# Patient Record
Sex: Female | Born: 1980 | Race: White | Hispanic: No | Marital: Married
Health system: Midwestern US, Academic
[De-identification: ages and names within clinical notes are randomized; demographics above are authoritative.]

---

## 2017-07-29 ENCOUNTER — Emergency Department: Admit: 2017-07-29 | Discharge: 2017-07-29 | Payer: BC Managed Care – HMO

## 2017-07-29 ENCOUNTER — Encounter: Admit: 2017-07-29 | Discharge: 2017-07-29 | Payer: BC Managed Care – HMO | Primary: Adult Health

## 2017-07-29 ENCOUNTER — Ambulatory Visit: Admit: 2017-07-29 | Discharge: 2017-07-30 | Primary: Adult Health

## 2017-07-29 DIAGNOSIS — R569 Unspecified convulsions: Principal | ICD-10-CM

## 2017-07-29 DIAGNOSIS — R109 Unspecified abdominal pain: ICD-10-CM

## 2017-07-29 DIAGNOSIS — M549 Dorsalgia, unspecified: ICD-10-CM

## 2017-07-29 DIAGNOSIS — I519 Heart disease, unspecified: Principal | ICD-10-CM

## 2017-07-29 DIAGNOSIS — K50819 Crohn's disease of both small and large intestine with unspecified complications: ICD-10-CM

## 2017-07-29 DIAGNOSIS — R112 Nausea with vomiting, unspecified: ICD-10-CM

## 2017-07-29 DIAGNOSIS — S0990XA Unspecified injury of head, initial encounter: ICD-10-CM

## 2017-07-29 LAB — URINALYSIS DIPSTICK
Lab: NEGATIVE K/UL — ABNORMAL HIGH (ref 3–12)
Lab: NEGATIVE MMOL/L (ref 21–30)
Lab: NEGATIVE U/L — ABNORMAL HIGH (ref 25–110)
Lab: NEGATIVE U/L — ABNORMAL HIGH (ref 7–56)

## 2017-07-29 LAB — URINALYSIS, MICROSCOPIC

## 2017-07-29 LAB — CBC AND DIFF
Lab: 0.1 10*3/uL (ref 0–0.20)
Lab: 0.1 10*3/uL (ref 0–0.45)
Lab: 20 10*3/uL — ABNORMAL HIGH (ref 4.5–11.0)

## 2017-07-29 LAB — POC GLUCOSE: Lab: 67 mg/dL — ABNORMAL LOW (ref 70–100)

## 2017-07-29 LAB — COMPREHENSIVE METABOLIC PANEL
Lab: 134 MMOL/L — ABNORMAL LOW (ref 137–147)
Lab: >60 mL/min (ref >60)
Lab: >60 mL/min — ABNORMAL HIGH (ref >60)

## 2017-07-29 LAB — MAGNESIUM: Lab: 1.9 mg/dL — ABNORMAL LOW (ref 1.6–2.6)

## 2017-07-29 MED ORDER — LIDOCAINE HCL 20 MG/ML (2 %) IJ SOLN
50 mL | Freq: Once | INTRAMUSCULAR | 0 refills | Status: AC
Start: 2017-07-29 — End: ?

## 2017-07-29 MED ORDER — HYDROCODONE-ACETAMINOPHEN 5-325 MG PO TAB
1 | ORAL | 0 refills | Status: DC | PRN
Start: 2017-07-29 — End: 2017-07-31
  Administered 2017-07-30 (×2): 1 via ORAL

## 2017-07-29 MED ORDER — VANCOMYCIN IN DEXTROSE 5 % 750 MG/150 ML IV PGBK
15 mg/kg | Freq: Two times a day (BID) | INTRAVENOUS | 0 refills | Status: DC
Start: 2017-07-29 — End: 2017-07-31
  Administered 2017-07-30 (×2): 750 mg via INTRAVENOUS

## 2017-07-29 MED ORDER — SODIUM CHLORIDE 0.9 % IJ SOLN
50 mL | Freq: Once | INTRAVENOUS | 0 refills | Status: CP
Start: 2017-07-29 — End: ?
  Administered 2017-07-29: 17:00:00 50 mL via INTRAVENOUS

## 2017-07-29 MED ORDER — FENTANYL CITRATE (PF) 50 MCG/ML IJ SOLN
50 ug | Freq: Once | INTRAVENOUS | 0 refills | Status: CP
Start: 2017-07-29 — End: ?
  Administered 2017-07-29: 16:00:00 50 ug via INTRAVENOUS

## 2017-07-29 MED ORDER — FENTANYL CITRATE (PF) 50 MCG/ML IJ SOLN
25 ug | INTRAVENOUS | 0 refills | Status: DC | PRN
Start: 2017-07-29 — End: 2017-07-30
  Administered 2017-07-30: 01:00:00 25 ug via INTRAVENOUS

## 2017-07-29 MED ORDER — LACTATED RINGERS IV SOLP
INTRAVENOUS | 0 refills | Status: DC
Start: 2017-07-29 — End: 2017-07-30
  Administered 2017-07-30: 03:00:00 1000.000 mL via INTRAVENOUS

## 2017-07-29 MED ORDER — HYDROCODONE-ACETAMINOPHEN 5-325 MG PO TAB
1 | ORAL | 0 refills | Status: DC | PRN
Start: 2017-07-29 — End: 2017-07-29

## 2017-07-29 MED ORDER — PIPERACILLIN-TAZOBACTAM-DEXTRS 3.375 GRAM/50 ML IV PGBK
3.375 g | Freq: Once | INTRAVENOUS | 0 refills | Status: CP
Start: 2017-07-29 — End: ?
  Administered 2017-07-29: 20:00:00 3.375 g via INTRAVENOUS

## 2017-07-29 MED ORDER — METOCLOPRAMIDE HCL 5 MG/ML IJ SOLN
5 mg | Freq: Once | INTRAVENOUS | 0 refills | Status: CP
Start: 2017-07-29 — End: ?
  Administered 2017-07-29: 16:00:00 5 mg via INTRAVENOUS

## 2017-07-29 MED ORDER — LACTATED RINGERS IV SOLP
1000 mL | Freq: Once | INTRAVENOUS | 0 refills | Status: CP
Start: 2017-07-29 — End: ?
  Administered 2017-07-29: 16:00:00 1000 mL via INTRAVENOUS

## 2017-07-29 MED ORDER — VANCOMYCIN PHARMACY TO MANAGE
1 | 0 refills | Status: DC
Start: 2017-07-29 — End: 2017-07-31

## 2017-07-29 MED ORDER — TRAMADOL 50 MG PO TAB
50 mg | ORAL | 0 refills | Status: DC | PRN
Start: 2017-07-29 — End: 2017-07-29

## 2017-07-29 MED ORDER — VANCOMYCIN IN DEXTROSE 5 % 750 MG/150 ML IV PGBK
15 mg/kg | INTRAVENOUS | 0 refills | Status: DC
Start: 2017-07-29 — End: 2017-07-30

## 2017-07-29 MED ORDER — CEFTRIAXONE INJ 2GM IVP
2 g | Freq: Two times a day (BID) | INTRAVENOUS | 0 refills | Status: DC
Start: 2017-07-29 — End: 2017-07-31
  Administered 2017-07-30 (×2): 2 g via INTRAVENOUS

## 2017-07-29 MED ORDER — ENOXAPARIN 40 MG/0.4 ML SC SYRG
40 mg | Freq: Two times a day (BID) | SUBCUTANEOUS | 0 refills | Status: CN
Start: 2017-07-29 — End: ?

## 2017-07-29 MED ORDER — VANCOMYCIN PHARMACY TO MANAGE
1 | 0 refills | Status: DC
Start: 2017-07-29 — End: 2017-07-30

## 2017-07-29 MED ORDER — IOPAMIDOL 76 % IV SOLN
70 mL | Freq: Once | INTRAVENOUS | 0 refills | Status: CP
Start: 2017-07-29 — End: ?
  Administered 2017-07-29: 17:00:00 70 mL via INTRAVENOUS

## 2017-07-29 NOTE — Consults
Neurology Consultation      Name:  Alyssa Cochran                                             MRN:  [id number]   Admission Date:  07/29/2017  LOS: 0 days  ED31/31                 ASSESSMENT:    Active Problems:    * No active hospital problems. *    Consult type: Opinion with orders    Alyssa Cochran is a 36 y.o.  female with PMH of generalized anxiety disorder, depression and chronic abdominal pain that neurology is consulted to evaluate for possible seizures.     Neuro exam reveals somnolent, emaciated appearing woman with otherwise intact mentation, no cranial nerve abnormalities, intact strength with some giveaway due to reported fatigue, no sensory deficits, brisk reflexes with cross-adductor +, no clonus, toes down going bilaterally.     Imaging/Diagnostic Studies:  WBC 20K  Glucose 61   AST 127  ALT 89   ALK Phos 221    CT Head: No acute abnormality  CT C spine: No acute fracture   CT Abd/Pelvis: Apparent wall thickening and enhancement of the terminal ileum as well as mild enhancement of the ascending colon concerning for inflammatory enterocolitis given the history of Crohn's disease.     Impression:   1. Suspected Generalized Seizure with Fall/Somnolence   36 year old female with no prior seizure history of risk factors except an uncle with Epilepsy presenting with convulsive event on the way to [place] to be evaluated by GI. Event witnessed by husband. Right dystonic posturing along with generalized clonic activity reported from husband as well as choking appearance. Description of event is consistent with generalized seizure. Some element of focality with right dystonic posturing however. Reactive elevation of WBC count, generalized muscle soreness and some somnolence consistent with recent prior convulsive event. Given context of significant stress due to ongoing abdominal pain, insomnia, poor PO intake, and evidence of significant dehydration, seizure was likely provoked. Given some elements of focality and impending admission to the hospital, reasonable to consider obtained workup including MRI Brain with seizure protocol and routine EEG.     Internal medicine impression of possible meningitis due to immunocompromised state, neck stiffness, reported headache and somnolence on examination. Performed beside LP.     2. Chronic Abdominal Pain, Prior Intussusception, Concern for Crohn's Disease   2 year history of recurrent abdominal pains, bowel changes, ex lap requiring small bowel resection due to intussusception. Prior EGD/Colonoscopy. Evidence of focal bowel inflammation on outside records. Carrying diagnosis of Crohn's disease however this has not been confirmed with GI here at [facility]. CT Abdomen with focal bowel inflammation which certainly is suggestive of a Crohn's Flare. GI has been consulted. Patient has been taking 40 mg Prednisone daily for some time making her immunocompromised.      3. Dehydration/Poor Nutritional Status   Patient reports 25 pound weight loss since onset of her abdominal pain. Wt loss ~125->95 pounds in two years. Dr mucous membranes on examination and emaciated appearance. Would benefit from nutritional assessment and concern for malabsorptive process.     RECOMMENDATIONS:  > Recommend admission to medicine for IV hydration, GI consult, Nutritional assessment   > Given abnormal posturing of right hand during event suggestive of some  focality, reasonable to proceed with seizure work up including MRI Brain (Seizure Protocol), Routine EEG  > Seizure precautions; no driving for 6 months from your last spell, this is state law. No climbing ladders, operating heavy machinery, carrying boiling water, bathing or swimming alone, or anything that may cause harm to you or those around you if you were to have spell/seizure.   > Will follow up results of bedside LP, current receiving empiric antibiotics for suspected community acquired meningitis Thank you for allowing [place] to participate in the care of this patient. Please page neurology on call with any further questions or concerns.     Patient seen and plan of care discussed with Dr. Alvester Morin and communicated to primary team.  __________________________________________________________________________________    History of Present Illness: Alyssa Cochran is a 36 y.o. female being seen for neurologic evaluation regarding possible seizure.     History obtained from patient and husband. Husband reports they were driving to her GI appointment this morning and stopped in at a rest station at about 0730 AM. He reports sitting in his car and she was out of the care walking away from him. He looked down at his phone and looked up and saw his wife lying on her back with her right arm straight up in the air and her left arm and legs were shaking repeatedly. He reports her eyes were closed but he opened her eyes and saw they looked rolled back. There was no deviation. He thinks she was choking a little bit. She was rolled on her side.  This lasted for about 45 seconds. Her husband reports she was confused after this event for about 10 minutes and was also complaining of nausea and vomited 6x. Mostly liquid in nature. No tongue biting or bladder incontinence.     She reports no prior symptoms to the event. She reports just blacking out. She has a headache now in the right side of the back of her head. She reports feeling achy all over.     She reports having the flu shot about 3 weeks ago followed by flu like symptoms that have passed. She otherwise feels well.     Risk Factors: She reports an uncle with Epilepsy. She has had no prior seizures. Denies significant head trauma. Highest education level sophomore of high school. No developmental or birth abnormalities.     This has never happened to her before.     She reports taking tramadol for pain control. She repots 2-3 pills per day for the pain. She has been taking this for about a year for abdominal pain. She has not been sleeping very much in the past few nights.     She reports undergoing significant stress and poor sleep recently due to chronic fluctuations in her bowel habits, chronic abdominal pains that she is seeking GI evaluation for regarding possible crohns disease. She has lost about 25-30 pounds in the last two years.     Past Medical History:   Diagnosis Date   ??? Back pain    ??? Heart disease    ??? Stomach pain      Past Surgical History:   Procedure Laterality Date   ??? COLON SURGERY     ??? TUBAL LIGATION       Social History     Social History   ??? Marital status: Married     Spouse name: N/A   ??? Number of children: N/A   ??? Years of education:  N/A     Social History Main Topics   ??? Smoking status: Current Every Day Smoker     Packs/day: 1.00     Years: 15.00   ??? Smokeless tobacco: Never Used   ??? Alcohol use No   ??? Drug use: No   ??? Sexual activity: Not on file     Other Topics Concern   ??? Not on file     Social History Narrative   ??? No narrative on file      Family History   Problem Relation Age of Onset   ??? COPD Mother    ??? Arthritis Mother    ??? Heart Disease Father          Immunizations (includes history and patient reported):   There is no immunization history on file for this patient.        Allergies:  Patient has no known allergies.    Medications:  Prescriptions Prior to Admission   Medication Sig   ??? citalopram (CELEXA) 10 mg tablet Take 60 mg by mouth daily.   ??? electrolyte GUT PEG (NULYTELY, COLYTE, GAVILYTE-N) 420 gram oral solution Mix as directed on package. Drink (8oz) every 10 minutes until gone. Refrigerate once mixed.   ??? HYDROcodone/acetaminophen (NORCO) 5/325 mg tablet Take 1 tablet by mouth every 4 hours as needed for Pain   ??? other medication 1 Dose. Hydrocodone (out of medication)   ??? POTASSIUM (POTASSIMIN PO) Take 40 mEq by mouth daily.   ??? PREDNISONE PO Take 40 mg by mouth daily. ??? traMADol (ULTRAM) 50 mg tablet Take 1 tablet by mouth every 6 hours as needed for Pain. Indications: PAIN   ??? TRAMADOL HCL (TRAMADOL PO) Take  by mouth.     No current facility-administered medications on file prior to encounter.      Current Outpatient Prescriptions on File Prior to Encounter   Medication Sig Dispense Refill   ??? citalopram (CELEXA) 10 mg tablet Take 60 mg by mouth daily.     ??? electrolyte GUT PEG (NULYTELY, COLYTE, GAVILYTE-N) 420 gram oral solution Mix as directed on package. Drink (8oz) every 10 minutes until gone. Refrigerate once mixed. 4000 mL 0   ??? HYDROcodone/acetaminophen (NORCO) 5/325 mg tablet Take 1 tablet by mouth every 4 hours as needed for Pain 20 tablet 0   ??? other medication 1 Dose. Hydrocodone (out of medication)     ??? POTASSIUM (POTASSIMIN PO) Take 40 mEq by mouth daily.     ??? PREDNISONE PO Take 40 mg by mouth daily.     ??? traMADol (ULTRAM) 50 mg tablet Take 1 tablet by mouth every 6 hours as needed for Pain. Indications: PAIN 20 tablet 0   ??? TRAMADOL HCL (TRAMADOL PO) Take  by mouth.         Review of Systems:  Negative except as noted per HPI     Physical Exam:  Vital Signs: Last Filed In 24 Hours Vital Signs: 24 Hour Range   BP: 128/85 (11/08 1100)  Temp: 37.1 ???C (98.7 ???F) (11/08 0856)  Pulse: 78 (11/08 1100)  Respirations: 8 PER MINUTE (11/08 1100)  SpO2: 94 % (11/08 1100)  O2 Delivery: None (Room Air) (11/08 0856)  SpO2 Pulse: 78 (11/08 1100)  Height: 160 cm (63) (11/08 0856) BP: (124-135)/(85-96)   Temp:  [36.7 ???C (98 ???F)-37.1 ???C (98.7 ???F)]   Pulse:  [78-91]   Respirations:  [8 PER MINUTE-13 PER MINUTE]   SpO2:  [94 %-99 %]  O2 Delivery: None (Room Air)   Intensity Pain Scale (Self Report): 7 (07/29/17 0915)      HEENT: Right posterior scalp hematoma from recent fall, normocephalic, eyes open with no discharge, nares patent, oropharynx is clear with no lesions, palate intact, no bruits, dry mucous membranes Chest: normal configuration, non labored breathing, chest rise equal b/l   CV: normal rate and regular rhythm, no murmur, distal pulses palpable  Pulmonary: lungs are clear bilaterally, no wheezes/crackles/rales  Abd: soft, non- tender, no masses, no organomegaly  Skin: no rashes or lesions  Psych: normal      Neuro:  Mental status: Oriented to person/place/time/situation  Memory: Recent and remote memory intact  Attention: Limited Attention   Fund of knowledge: Low   Level of consciousness: Somnolent      Speech:    Fluency: Normal   Comprehension: Normal    Articulation: Normal   Repetition: Normal   Naming: Normal    CN II-XII: Fundoscopic exam without obvious abnormalities. Visual fields intact in all fields, PERRL, EOMI, facial sensation intact. Symmetrical facial movement. Hearing grossly intact to conversation. Strong cough, elevates palate b/l, uvula midline. Strong shoulder shrug. Tongue midline.    Motor: Normal tone and bulk. No abnormal movement, fasciculation or pronator drift. Some give away weakness noted throughout but able to afford full strength initially    NF  NE  SA  EF  EE  WE  WF  FF  FE  FA  TA  HF  HA  HE  KF  KE  DF  PF  In  Ev  TF  TE    R     5  5  5  5  5  5  5  5    5  5  5  5  5  5              L    5  5  5  5  5  5  5  5   5  5  5  5  5  5                Sensory:   Light touch: intact, without sensory level   Pin prick: intact   Vibration (tuning fork 128 Hz): intact   Proprioception: intact    Romberg sign: absent   Cold temp: intact     Reflexes: No clonus, hofmann, Babinski absent. Cross adductor present.    Right Left   Triceps 3 3   Biceps 3 3   Brachioradialis 3 3   Patella 3 3   Ankle 2 2     Cerebellar Funciton/fine movement: normal finger to nose, heel to shin, finger tapping, RAMs.  Gait: slow to rise from seated position; normal stride length and arm swing, no decompensation of turns. Patient able to walk on heels, toes and in tandem. Lab/Radiology/Other Diagnostic Tests:  24-hour labs:    Results for orders placed or performed during the hospital encounter of 07/29/17 (from the past 24 hour(s))   CBC AND DIFF    Collection Time: 07/29/17  9:18 AM   Result Value Ref Range    White Blood Cells 20.4 (H) 4.5 - 11.0 K/UL    RBC 4.54 4.0 - 5.0 M/UL    Hemoglobin 11.0 (L) 12.0 - 15.0 GM/DL    Hematocrit 45.4 36 - 45 %    MCV 79.5 (L) 80 - 100 FL    MCH 24.3 (L) 26 -  34 PG    MCHC 30.6 (L) 32.0 - 36.0 G/DL    RDW 45.4 (H) 11 - 15 %    Platelet Count 457 (H) 150 - 400 K/UL    MPV 8.4 7 - 11 FL    Neutrophils 76 41 - 77 %    Lymphocytes 18 (L) 24 - 44 %    Monocytes 6 4 - 12 %    Eosinophils 0 0 - 5 %    Basophils 0 0 - 2 %    Absolute Neutrophil Count 15.30 (H) 1.8 - 7.0 K/UL    Absolute Lymph Count 3.70 1.0 - 4.8 K/UL    Absolute Monocyte Count 1.20 (H) 0 - 0.80 K/UL    Absolute Eosinophil Count 0.10 0 - 0.45 K/UL    Absolute Basophil Count 0.10 0 - 0.20 K/UL   COMPREHENSIVE METABOLIC PANEL    Collection Time: 07/29/17  9:18 AM   Result Value Ref Range    Sodium 134 (L) 137 - 147 MMOL/L    Potassium 3.9 3.5 - 5.1 MMOL/L    Chloride 96 (L) 98 - 110 MMOL/L    Glucose 61 (L) 70 - 100 MG/DL    Blood Urea Nitrogen <2 (L) 7 - 25 MG/DL    Creatinine 0.98 0.4 - 1.00 MG/DL    Calcium 8.7 8.5 - 11.9 MG/DL    Total Protein 6.3 6.0 - 8.0 G/DL    Total Bilirubin 0.2 (L) 0.3 - 1.2 MG/DL    Albumin 3.4 (L) 3.5 - 5.0 G/DL    Alk Phosphatase 147 (H) 25 - 110 U/L    AST (SGOT) 127 (H) 7 - 40 U/L    CO2 29 21 - 30 MMOL/L    ALT (SGPT) 89 (H) 7 - 56 U/L    Anion Gap 9 3 - 12    eGFR Non African American >60 >60 mL/min    eGFR African American >60 >60 mL/min   MAGNESIUM    Collection Time: 07/29/17  9:18 AM   Result Value Ref Range    Magnesium 1.9 1.6 - 2.6 mg/dL   POC GLUCOSE    Collection Time: 07/29/17  9:20 AM   Result Value Ref Range    Glucose, POC 67 (L) 70 - 100 MG/DL   URINALYSIS DIPSTICK    Collection Time: 07/29/17  9:38 AM   Result Value Ref Range Color,UA YELLOW     Turbidity,UA 1+ (A) CLEAR-CLEAR    Specific Gravity-Urine 1.013 1.003 - 1.035    pH,UA 6.0 5.0 - 8.0    Protein,UA NEG NEG-NEG    Glucose,UA NEG NEG-NEG    Ketones,UA NEG NEG-NEG    Bilirubin,UA NEG NEG-NEG    Blood,UA NEG NEG-NEG    Urobilinogen,UA NORMAL NORM-NORMAL    Nitrite,UA NEG NEG-NEG    Leukocytes,UA NEG NEG-NEG    Urine Ascorbic Acid, UA NEG NEG-NEG   URINALYSIS, MICROSCOPIC    Collection Time: 07/29/17  9:38 AM   Result Value Ref Range    WBCs,UA 0-2 0 - 2 /HPF    RBCs,UA 0-2 0 - 3 /HPF    MucousUA TRACE     Bacteria,UA FEW (A) NEG-NEG    Squamous Epithelial Cells 10-20 0 - 5    Hyaline Cast 5-10    TOTAL PROTEIN-CSF    Collection Time: 07/29/17  6:50 PM   Result Value Ref Range    Total Protein,CSF 30 15 - 45 MG/DL   GLUCOSE-CSF    Collection Time: 07/29/17  6:50 PM   Result Value Ref Range    Glucose,CSF 35 (L) 40 - 75 MG/DL    Xanthrochromia,CSF NONE      Glucose: (!) 61 (07/29/17 0918)  POC Glucose (Download): (!) 67 (07/29/17 0920)  Urine Pregnancy: (S) Negative (07/29/17 1610)    Pertinent radiology reviewed.    Marty Heck, MD  Neurology Resident  Pager [phone]

## 2017-07-29 NOTE — Response Teams
Rapid Response Team Progress Note    Date: 07/29/2017 Time: 9:31 AM  Patient: Alyssa Cochran  Attending: Santa Lighter, MD Service: Emergency Medicine  Admission Date: 07/29/2017  LOS: 0 days    A Code/Rapid Response Timeline Event Report has been created for this patient on 07/29/17 at 0950. RRT was called after patient arrived for an MOB appointment and patients husband reported she had a seizure while at a rest stop and hit her head. Patient's vital signs were stable, patient did have a raised spot on the right side of her head where she fell. Patient taken to ED 31 for further evaluation.        Etheleen Nicks, RN

## 2017-07-29 NOTE — ED Notes
1710: Bed [id number] dirty. Call Leslee for report [id number]

## 2017-07-29 NOTE — Progress Notes
Rapid Response called at 8:29am.  Team arrived, assessment done and was transported by wheelchair to the ED.  Patient was alert and oriented with visible emesis in blue bag.  Patient requested norco prescription and discussed with patient to have 1st time seizure per Husband with fall, hitting head without loss of bladder to have assessment in the ED now.

## 2017-07-29 NOTE — Procedures
The risks and benefits of the procedure were discussed with the patient and informed consent was obtained. Time out was taken, all present agreed on procedure and site.     The patient was placed in the sitting position with the back arched bent forward arms rested on a bedside table rest.  The back was palpated to find the L4-L5 vertebral disc.  Once found the patient was prepped with iodine and ChloraPrep solution. This was allowed to dry.  A sterile drape was then placed over the exam area. 25 mcg of fentanyl were administered to anesthetize the patient. 5 cc of lidocaine were drawn up and the skin and tract were anesthetized.  A spinal needle was then assembled and a standard lumbar puncture approach was taken.  The needle was slowly advanced until return of CSF was found.  Opening pressure was noted to be 24.2 mm of mercury.  4 specimen bottles of 2-3 mL each were obtained and sent to the laboratory for fluid testing. There was minimal xanthochromia seen amongst all fluid obtained.  Minimal blood loss was seen.  The needle was then removed from the spinal tract.  And manual pressure was held for about 1 minute.  A sterile Band-Aid was then placed upon the site of insertion from the needle.  Estimated blood loss was negligible from the procedure.  Patient tolerated the procedure well was understanding of the successful procedure.  She was then placed supine in bed and instructed to stay this way for at least 1 hour.    I perform the exam under direct supervision of Dr. Edd Fabian.    Ernest Haber, MD  Internal Medicine PGY-1    Pager [phone]

## 2017-07-29 NOTE — ED Notes
Report given to Leslee, RN.

## 2017-07-29 NOTE — ED Notes
Pt care assumed from [place], B. Report received. Pt resting on cart waiting for Neurology consult at this time.

## 2017-07-29 NOTE — H&P (View-Only)
Admission History and Physical Examination      Name:  Alyssa Cochran                                             MRN:  [id number]   Admission Date:  07/29/2017                     Assessment/Plan:    Active Problems:    Seizure (HCC)    Alyssa Cochran is a 36 y/o F with a pmh of Crohn's disease who was admitted for a first time seizure after a fall.    #Trauma  #Seizure  -Status post a fall, witnessed by husband  -Febrile 1 week ago, with neck stiffness and altered mental status, photophobia and phonophobia.  -Neuro consulted in the ED.  Plan:  -Neuro consulted, appreciate recs  -Ordered UDS, UA, alcohol level ordered  -Blood cultures ordered in the ED  -Will complete LP at the bedside, and then start on prophylactic antibiotics for community acquired meningitis   -Ordered fluid studies    #Suspected Crohn's disease flare  -Seen previously in GI clinic ordered extensive work-up, but unable to complete due to pt being lost to follow up  Plan:  -Ordered ESR and CRP  -Consult GI    #Depression  -pta antidepressant    #Severe Malnutrition  -Consult dietary    FEN: LR 75 mL/hr, replace lytes prn, Regular Diet  Ppx: holding due to procedure  Code: Full  Dispo: admit to Med-1    Pt seen and discussed with Dr. Vivien Rossetti, MD  Internal Medicine PGY-1    Pager [phone]  __________________________________________________________________________________  Primary Care Physician: Ellyn Hack  PCP Unknown    Chief Complaint:  Seizure  History of Present Illness: Alyssa Cochran is a 36 y.o. female Patient presents for a GI clinic appointment.  She was seen in GI clinic over a year ago for evaluation of Crohn's disease and malnutrition and we had recommended extensive workup including labs, repeat endoscopies, and MR enterography. Patient did not have any of these tests performed due to insurance reasons and has been following with her original gastroenterologist.   ??? The patient states her husband and her were at a rest stop prior to coming in for her gastroenterology appointment, and her husband found her laying outside the car on the pavement seizing.  He also states her right arm was abducted and pronated during this episode.  She denies any history of seizures or seizure activity.  She states that she was febrile 1 week ago with a T-max of 100.4.  This was the day after getting her flu vaccine, and the patient attributed her fever to this.  She did not have any additional fevers.  She has had a headache for the last 2 days that she states is similar to her previous migraine headaches she has had in the past.  She admits to photophobia and phonophobia.  Multiple episodes of nausea and vomiting.  Husband states emesis has not been bloody, and has been the [employer] she drank .  She admits to neck stiffness 2 days ago that her husband massaged her neck muscles and has not had neck stiffness since.  She states she has had no urinary symptoms.  In regards to her history of Crohn's disease, she states  that her bowels have been very hyperactive with multiple bowel movements.  She does however state that the bowel movements are normal for her.  She states she takes prednisone 40 daily for her Crohn's disease.  She is in significant pain, and states that the pain medicine that she received in the ED has made her very sleepy.  Husband states she has not slept for the last 2 days and attributes the sleepiness to this as well.    Past Medical History:   Diagnosis Date   ??? Back pain    ??? Heart disease    ??? Stomach pain      Past Surgical History:   Procedure Laterality Date   ??? COLON SURGERY     ??? TUBAL LIGATION       Family History   Problem Relation Age of Onset   ??? COPD Mother    ??? Arthritis Mother    ??? Heart Disease Father      Social History     Social History   ??? Marital status: Married     Spouse name: N/A   ??? Number of children: N/A   ??? Years of education: N/A Social History Main Topics   ??? Smoking status: Current Every Day Smoker     Packs/day: 1.00     Years: 15.00   ??? Smokeless tobacco: Never Used   ??? Alcohol use No   ??? Drug use: No   ??? Sexual activity: Not on file     Other Topics Concern   ??? Not on file     Social History Narrative   ??? No narrative on file      Immunizations (includes history and patient reported):   There is no immunization history on file for this patient.        Allergies:  Patient has no known allergies.    Medications:    (Not in a hospital admission)  Review of Systems:  A 14 point review of systems was negative except for: Negative except in above HPI    Physical Exam:  Vital Signs: Last Filed In 24 Hours Vital Signs: 24 Hour Range   BP: 105/70 (11/08 1400)  Temp: 37.1 ???C (98.7 ???F) (11/08 4403)  Pulse: 83 (11/08 1400)  Respirations: 12 PER MINUTE (11/08 1400)  SpO2: 95 % (11/08 1400)  O2 Delivery: None (Room Air) (11/08 0856)  SpO2 Pulse: 84 (11/08 1400)  Height: 160 cm (63) (11/08 0856) BP: (105-135)/(70-102)   Temp:  [36.7 ???C (98 ???F)-37.1 ???C (98.7 ???F)]   Pulse:  [78-91]   Respirations:  [8 PER MINUTE-14 PER MINUTE]   SpO2:  [94 %-99 %]   O2 Delivery: None (Room Air)   Intensity Pain Scale (Self Report): 7 (07/29/17 0915)      General:  Alert, cooperative, mild distress, appears stated age  Head: Right-sided scalp abrasion tender to the touch.  Minimal serosanguineous exudate exudate.  Throat: Lips, mucosa and tongue normal.  Teeth and gums normal  Neck:    Supple, symmetrical, trachea midline  Back:  Symmetric, no curvature, ROM normal.  Lungs: Diffuse inspiratory and expiratory wheezes heard bilaterally.  Chest wall:  No tenderness or deformity.  Heart:   Regular rate and rhythm  Abdomen:  Soft.  Diffusely tender to palpation throughout abdomen. Bowel sounds normal.  No masses.  No organomegaly.  Extremities: Extremities normal, atraumatic, no cyanosis or edema  Skin: Skin color, texture, turgor normal.  No rashes or lesions Neurologic: CNII -  XII intact.  Normal strength, sensation and reflexes throughout.    Lab/Radiology/Other Diagnostic Tests:  24-hour labs:    Results for orders placed or performed during the hospital encounter of 07/29/17 (from the past 24 hour(s))   CBC AND DIFF    Collection Time: 07/29/17  9:18 AM   Result Value Ref Range    White Blood Cells 20.4 (H) 4.5 - 11.0 K/UL    RBC 4.54 4.0 - 5.0 M/UL    Hemoglobin 11.0 (L) 12.0 - 15.0 GM/DL    Hematocrit 16.1 36 - 45 %    MCV 79.5 (L) 80 - 100 FL    MCH 24.3 (L) 26 - 34 PG    MCHC 30.6 (L) 32.0 - 36.0 G/DL    RDW 09.6 (H) 11 - 15 %    Platelet Count 457 (H) 150 - 400 K/UL    MPV 8.4 7 - 11 FL    Neutrophils 76 41 - 77 %    Lymphocytes 18 (L) 24 - 44 %    Monocytes 6 4 - 12 %    Eosinophils 0 0 - 5 %    Basophils 0 0 - 2 %    Absolute Neutrophil Count 15.30 (H) 1.8 - 7.0 K/UL    Absolute Lymph Count 3.70 1.0 - 4.8 K/UL    Absolute Monocyte Count 1.20 (H) 0 - 0.80 K/UL    Absolute Eosinophil Count 0.10 0 - 0.45 K/UL    Absolute Basophil Count 0.10 0 - 0.20 K/UL   COMPREHENSIVE METABOLIC PANEL    Collection Time: 07/29/17  9:18 AM   Result Value Ref Range    Sodium 134 (L) 137 - 147 MMOL/L    Potassium 3.9 3.5 - 5.1 MMOL/L    Chloride 96 (L) 98 - 110 MMOL/L    Glucose 61 (L) 70 - 100 MG/DL    Blood Urea Nitrogen <2 (L) 7 - 25 MG/DL    Creatinine 0.45 0.4 - 1.00 MG/DL    Calcium 8.7 8.5 - 40.9 MG/DL    Total Protein 6.3 6.0 - 8.0 G/DL    Total Bilirubin 0.2 (L) 0.3 - 1.2 MG/DL    Albumin 3.4 (L) 3.5 - 5.0 G/DL    Alk Phosphatase 811 (H) 25 - 110 U/L    AST (SGOT) 127 (H) 7 - 40 U/L    CO2 29 21 - 30 MMOL/L    ALT (SGPT) 89 (H) 7 - 56 U/L    Anion Gap 9 3 - 12    eGFR Non African American >60 >60 mL/min    eGFR African American >60 >60 mL/min   MAGNESIUM    Collection Time: 07/29/17  9:18 AM   Result Value Ref Range    Magnesium 1.9 1.6 - 2.6 mg/dL   POC GLUCOSE    Collection Time: 07/29/17  9:20 AM   Result Value Ref Range    Glucose, POC 67 (L) 70 - 100 MG/DL URINALYSIS DIPSTICK    Collection Time: 07/29/17  9:38 AM   Result Value Ref Range    Color,UA YELLOW     Turbidity,UA 1+ (A) CLEAR-CLEAR    Specific Gravity-Urine 1.013 1.003 - 1.035    pH,UA 6.0 5.0 - 8.0    Protein,UA NEG NEG-NEG    Glucose,UA NEG NEG-NEG    Ketones,UA NEG NEG-NEG    Bilirubin,UA NEG NEG-NEG    Blood,UA NEG NEG-NEG    Urobilinogen,UA NORMAL NORM-NORMAL    Nitrite,UA NEG NEG-NEG  Leukocytes,UA NEG NEG-NEG    Urine Ascorbic Acid, UA NEG NEG-NEG   URINALYSIS, MICROSCOPIC    Collection Time: 07/29/17  9:38 AM   Result Value Ref Range    WBCs,UA 0-2 0 - 2 /HPF    RBCs,UA 0-2 0 - 3 /HPF    MucousUA TRACE     Bacteria,UA FEW (A) NEG-NEG    Squamous Epithelial Cells 10-20 0 - 5    Hyaline Cast 5-10      Glucose: (!) 61 (07/29/17 0918)  POC Glucose (Download): (!) 67 (07/29/17 0920)  Urine Pregnancy: (S) Negative (07/29/17 1610)  Pertinent radiology reviewed.    Richarda Osmond, MD  Pager [phone]

## 2017-07-29 NOTE — ED Notes
Neurology at bedside.

## 2017-07-30 ENCOUNTER — Encounter: Admit: 2017-07-30 | Discharge: 2017-07-30 | Payer: BC Managed Care – HMO | Primary: Adult Health

## 2017-07-30 LAB — URINALYSIS DIPSTICK REFLEX TO CULTURE
Lab: NEGATIVE
Lab: NEGATIVE
Lab: NEGATIVE
Lab: NEGATIVE
Lab: NEGATIVE

## 2017-07-30 LAB — CELL COUNT W/DIFF-CSF
Lab: 12 /uL — ABNORMAL HIGH (ref <5)
Lab: 4 %
Lab: 87 %
Lab: 9 %

## 2017-07-30 LAB — GRAM STAIN

## 2017-07-30 LAB — POC GLUCOSE
Lab: 104 mg/dL — ABNORMAL HIGH (ref 70–100)
Lab: 60 mg/dL — ABNORMAL LOW (ref 70–100)
Lab: 88 mg/dL (ref >60)

## 2017-07-30 LAB — CANNABINOIDS-URINE RANDOM: Lab: NEGATIVE (ref 5.0–8.0)

## 2017-07-30 LAB — COMPREHENSIVE METABOLIC PANEL: Lab: 139 MMOL/L — ABNORMAL LOW (ref 137–147)

## 2017-07-30 LAB — SED RATE: Lab: 34 mm/h — ABNORMAL HIGH (ref 0–20)

## 2017-07-30 LAB — EPSTEIN BARR QNT CSF

## 2017-07-30 LAB — GLUCOSE-CSF: Lab: 35 mg/dL — ABNORMAL LOW (ref 40–75)

## 2017-07-30 LAB — HERPES SIMPLEX PCR - NON-BLOOD

## 2017-07-30 LAB — COCAINE-URINE RANDOM: Lab: NEGATIVE /HPF (ref 0–3)

## 2017-07-30 LAB — BENZODIAZEPINES-URINE RANDOM: Lab: NEGATIVE (ref 0–5)

## 2017-07-30 LAB — URINALYSIS MICROSCOPIC REFLEX TO CULTURE

## 2017-07-30 LAB — C REACTIVE PROTEIN (CRP): Lab: 1.2 mg/dL — ABNORMAL HIGH (ref <1.0)

## 2017-07-30 LAB — CBC AND DIFF: Lab: 12 K/UL — ABNORMAL HIGH (ref 4.5–11.0)

## 2017-07-30 LAB — AMPHETAMINES-URINE RANDOM: Lab: NEGATIVE

## 2017-07-30 LAB — ALCOHOL LEVEL: Lab: <10 mg/dL — AB

## 2017-07-30 LAB — TOTAL PROTEIN-CSF: Lab: 30 mg/dL (ref 15–45)

## 2017-07-30 LAB — HUMAN HERPES VIRUS 6 QUANT PCR CSF

## 2017-07-30 MED ORDER — IMS MIXTURE TEMPLATE
30 mg | Freq: Every day | ORAL | 0 refills | Status: DC
Start: 2017-07-30 — End: 2017-07-31

## 2017-07-30 MED ORDER — AMPICILLIN 2G/100ML NS IVPB (MB+)
2 g | INTRAVENOUS | 0 refills | Status: DC
Start: 2017-07-30 — End: 2017-07-31
  Administered 2017-07-30 (×2): 2 g via INTRAVENOUS

## 2017-07-30 MED ORDER — NICOTINE 14 MG/24 HR TD PT24
1 | Freq: Every day | TRANSDERMAL | 0 refills | Status: DC
Start: 2017-07-30 — End: 2017-07-31
  Administered 2017-07-30: 22:00:00 1 via TRANSDERMAL

## 2017-07-30 MED ORDER — PREDNISONE 20 MG PO TAB
40 mg | Freq: Every day | ORAL | 0 refills | Status: DC
Start: 2017-07-30 — End: 2017-07-30
  Administered 2017-07-30: 15:00:00 40 mg via ORAL

## 2017-07-30 MED ORDER — CITALOPRAM 40 MG PO TAB
40 mg | Freq: Every day | ORAL | 0 refills | Status: DC
Start: 2017-07-30 — End: 2017-07-31

## 2017-07-30 MED ORDER — PREDNISONE 10 MG PO TAB
30 mg | ORAL_TABLET | Freq: Every day | ORAL | 0 refills | Status: SS
Start: 2017-07-30 — End: 2017-08-16

## 2017-07-30 MED ORDER — PANTOPRAZOLE 40 MG PO TBEC
40 mg | Freq: Every day | ORAL | 0 refills | Status: DC
Start: 2017-07-30 — End: 2017-07-31

## 2017-07-30 MED ORDER — CITALOPRAM 20 MG PO TAB
10 mg | Freq: Every day | ORAL | 0 refills | Status: DC
Start: 2017-07-30 — End: 2017-07-30
  Administered 2017-07-30: 19:00:00 10 mg via ORAL

## 2017-07-30 MED ORDER — PREDNISONE 10 MG PO TAB
10 mg | ORAL_TABLET | Freq: Every day | ORAL | 0 refills | Status: SS
Start: 2017-07-30 — End: 2017-08-16

## 2017-07-30 MED ORDER — POLYETHYLENE GLYCOL 3350 17 GRAM PO PWPK
1 | Freq: Two times a day (BID) | ORAL | 0 refills | Status: DC
Start: 2017-07-30 — End: 2017-07-31

## 2017-07-30 MED ORDER — SENNOSIDES 8.6 MG PO TAB
1 | Freq: Two times a day (BID) | ORAL | 0 refills | Status: DC
Start: 2017-07-30 — End: 2017-07-31

## 2017-07-30 MED ORDER — CITALOPRAM 20 MG PO TAB
30 mg | Freq: Once | ORAL | 0 refills | Status: CP
Start: 2017-07-30 — End: ?
  Administered 2017-07-30: 20:00:00 30 mg via ORAL

## 2017-07-30 MED ORDER — ENOXAPARIN 30 MG/0.3 ML SC SYRG
30 mg | Freq: Every day | SUBCUTANEOUS | 0 refills | Status: DC
Start: 2017-07-30 — End: 2017-07-31

## 2017-07-30 MED ORDER — DEXTROSE 5%-0.9% SODIUM CHLORIDE IV SOLP
INTRAVENOUS | 0 refills | Status: DC
Start: 2017-07-30 — End: 2017-07-31
  Administered 2017-07-30: 19:00:00 1000.000 mL via INTRAVENOUS

## 2017-07-30 MED ORDER — SENNOSIDES 8.6 MG PO TAB
1 | Freq: Every evening | ORAL | 0 refills | Status: DC
Start: 2017-07-30 — End: 2017-07-30

## 2017-07-30 MED ORDER — PREDNISONE 10 MG PO TAB
5 mg | ORAL_TABLET | Freq: Every day | ORAL | 0 refills | Status: SS
Start: 2017-07-30 — End: 2017-08-16

## 2017-07-30 MED ORDER — PREDNISONE 10 MG PO TAB
20 mg | ORAL_TABLET | Freq: Every day | ORAL | 0 refills | Status: SS
Start: 2017-07-30 — End: 2017-08-16

## 2017-07-30 MED ADMIN — SODIUM CHLORIDE 0.9 % IJ SOLN [7319]: 10 mL | INTRAVENOUS | @ 15:00:00 | Stop: 2017-07-30 | NDC 00409488801

## 2017-07-30 NOTE — Progress Notes
RT Adult Assessment Note    NAME:Alyssa Cochran             MRN: [id number]             DOB:1980-11-25          AGE: 36 y.o.  ADMISSION DATE: 07/29/2017             DAYS ADMITTED: LOS: 1 day    RT Treatment Plan:   Place a nursing order for "IS Q1h While Awake"         Additional Comments:  Impressions of the patient: No apparent distress  Intervention(s)/outcome(s): IS  Patient education that was completed: None  Recommendations to the care team: IS    Vital Signs:  Pulse:  72  RR:  18  SpO2:  93  O2 Device:  None  Liter Flow:    O2%:    Breath Sounds:  Diminished and clear throughout  Respiratory Effort:  Non labored

## 2017-07-30 NOTE — Other
Critical result or procedure called (document test and value, and read back): Glucose 49 Kali  Time MD/NP Notified: 0706  MD/NP Name: MD Mordecai Maes  MD/NP Response/Orders Given:  RN following Hypoglycemic protocol

## 2017-07-30 NOTE — Progress Notes
General Progress Note    Name:  GWEN WOGOMAN   ZOXWR'[id number] Date:  07/30/2017  Admission Date: 07/29/2017  LOS: 1 day                     Assessment/Plan:    Active Problems:    Seizure (HCC)    Bethanne Lundstedt Rosenberger is a 36 y/o F with a pmh of Crohn's disease who was admitted for a first time seizure after a fall.  ???  #Trauma  #Seizure  -Status post a fall, witnessed by husband  -Febrile 1 week ago, with neck stiffness and altered mental status, photophobia and phonophobia.  -Neuro consulted in the ED.  -UDS and alcohol levels negative  Plan:  -Neuro consulted, appreciate recs   -Ordered MRI W&W/O and EEG.  -Blood cultures NGTD  -LP done at the bedside 11/9   -Fluid studies show a mixed picture. With normal protein and slightly decreased glucose with slightly elevated WBCs(showing PMN predominance)   -Will continue Vanc/CFTX D2    -Pt on long term prednisone, questionable Imunnocompromised. Considered adding acyclovir and ampicillin, but will wait for ID recs.  -Consulted ID  ???  #Suspected Crohn's disease flare  -Seen previously in GI clinic ordered extensive work-up, but unable to complete due to pt being lost to follow up  Plan:  -ESR and CRP elevated  -Start to taper prednisone, starting at 30 mg today  -Consult GI, appreciate recs  ???  #Depression  -pta celexa  ???  #Severe Malnutrition  -Consult dietary  ???  FEN: D5NS 75 mL/hr, replace lytes prn, Regular Diet  Ppx: Lovenox 30mg   Code: Full  Dispo: admit to Med-1  ???  Pt seen and discussed with Dr. Edd Fabian  ???  Ernest Haber, MD  Internal Medicine PGY-1  ???  Pager [phone]  ________________________________________________________________________    Subjective  Haig Prophet Wussow is a 35 y.o. female.  Patient states she feels about the same as yesterday. Denies fevers chills overnight. She states her back has been fine since her LP last night. She states her pain has been well controlled with norco. She has not had a BM, but steas her abdominal pian is about the same. Medications  Scheduled Meds:  cefTRIAXone (ROCEPHIN) IVP 2 g 2 g Intravenous Q12H*   lidocaine 2% (20 mg/mL) injection 50 mL 50 mL Injection ONCE   prednisone (DELTASONE) tablet 40 mg 40 mg Oral QDAY   vancomycin  (VANCOCIN)  750 mg in D5W IVPB (premade) 15 mg/kg Intravenous Q12H*   Continuous Infusions:  ??? lactated ringers infusion 75 mL/hr at 07/29/17 2122     PRN and Respiratory Meds:fentaNYL citrate PF Q1H PRN, HYDROcodone/acetaminophen Q6H PRN, vancomycin   IVPB Q12H* **AND** vancomycin, pharmacy to manage Per Pharmacy      Objective:                          Vital Signs: Last Filed                 Vital Signs: 24 Hour Range   BP: 126/80 (11/09 0718)  Temp: 36.8 ???C (98.2 ???F) (11/09 1191)  Pulse: 81 (11/09 0718)  Respirations: 18 PER MINUTE (11/09 0718)  SpO2: 95 % (11/09 0718)  O2 Delivery: None (Room Air) (11/09 0718)  SpO2 Pulse: 74 (11/08 1630)  Height: 160 cm (63) (11/08 1800) BP: (103-134)/(59-102)   Temp:  [36.8 ???C (98.2 ???F)-37.3 ???C (99.1 ???F)]  Pulse:  [67-86]   Respirations:  [8 PER MINUTE-18 PER MINUTE]   SpO2:  [92 %-98 %]   O2 Delivery: None (Room Air)   Intensity Pain Scale (Self Report): 8 (07/30/17 4782) Vitals:    07/29/17 0856 07/29/17 1800   Weight: 43.1 kg (95 lb) 43 kg (94 lb 11.2 oz)       Intake/Output Summary:  (Last 24 hours)    Intake/Output Summary (Last 24 hours) at 07/30/17 0958  Last data filed at 07/30/17 0900   Gross per 24 hour   Intake              240 ml   Output              400 ml   Net             -160 ml           Physical Exam  General appearance: alert, poor nutritional state, cooperative, mild distress and toxic  Neurologic: Grossly normal  Lungs: clear to auscultation bilaterally  Heart: regular rate and rhythm  Abdomen: soft, diffusely tender to palpation with no focal spot of tenderness. Bowel sounds normal. No masses,  no organomegaly  Back:LP site in tact with no bleeding  Extremities: extremities normal, atraumatic, no cyanosis or edema    Lab Review Pertinent labs reviewed    Point of Care Testing  (Last 24 hours)  Glucose: (!) 49 (Critical Value GLU: Called To: ADAM C at: 07:04:59 by: KRO Read back by: ADAM   C  ) (07/30/17 0544)  POC Glucose (Download): 88 (07/30/17 0847)    Radiology and other Diagnostics Review:    Pertinent radiology reviewed.    Richarda Osmond, MD   Pager [phone]

## 2017-07-30 NOTE — Progress Notes
Patient arrived on unit via cart accompanied by family. Patient transferred to the bed with assistance. Assessment completed, refer to flowsheet for details. Orders released, reviewed, and implemented as appropriate. Oriented to surroundings, call light within reach. Plan of care reviewed.  Will continue to monitor and assess.

## 2017-07-30 NOTE — Med Student Progress Note
Medical Student Progress Note -  Inpatient    NAME: Alyssa Cochran                                     MRN: [id number]                 DOB: Nov 15, 1980          AGE: 36 y.o.  ADMISSION DATE: 07/29/2017             DAYS ADMITTED: LOS: 1 day                   General Progress Note    Name:  Alyssa Cochran   [id number] Date:  07/30/2017  Admission Date: 07/29/2017  LOS: 1 day                     Assessment/Plan:    Active Problems:    Seizure [facility])    Mrs. Alyssa Cochran is a pleasant 36 y/o lady with PMH significant for bipolar disorder, anorexia nervosa, recurrent gallstones s/p cholecystectomy, depression and bipolar disorder who is here for diffuse abdominal pain, headache and a suspected seizure with dystonic activity.     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 movement  - S/p fall, husband witnessed patient having tense jerky movements after a fall at a rest stop  - Patient states she was febrile 1 week ago with neck rigidity  Plan  - Neuro consulted, will follow with their recommendations  - Pending UDS, UA, alcohol level undetectable  - CSF and blood cultures pending, CSF cell count shows WBC of 12, RBC of 4700 and 87% neutrophils, glucose of 35, protein of 30, findings concerning for with bacterial meningitis  Plan  - Patient is on IV rosephin and vancomycin  - Will start dexamethasone today 11/9    Suspected Crohn's disease flare  - Patient was seen last year in GI clinic last year, extensive lab workup was ordered   - Patient does not have a diagnosis of Crohn's disease  - Has been taking 40 mg Prednisone PO daily for the past 1 year  - ESR elevated, CRP normal  Plan  - Obtain [facility]  - Symptomatic treatment only    Severe Malnutrition  - Consult dietiary  ???  FEN: IVF, regular diet  PPX: N/A  Code: Full  Dispo: continue inpatient care  ???  Patient was seen and examined with Dr. Syble Creek, MS3    ________________________________________________________________________    Subjective Alyssa Cochran is a 36 y.o. female.  Patient still somnolent this morning, reports of headache and photophobia    Medications  Scheduled Meds:  cefTRIAXone (ROCEPHIN) IVP 2 g 2 g Intravenous Q12H*   lidocaine 2% (20 mg/mL) injection 50 mL 50 mL Injection ONCE   prednisone (DELTASONE) tablet 40 mg 40 mg Oral QDAY   vancomycin  (VANCOCIN)  750 mg in D5W IVPB (premade) 15 mg/kg Intravenous Q12H*   Continuous Infusions:  ??? lactated ringers infusion 75 mL/hr at 07/29/17 2122     PRN and Respiratory Meds:fentaNYL citrate PF Q1H PRN, HYDROcodone/acetaminophen Q6H PRN, vancomycin   IVPB Q12H* **AND** vancomycin, pharmacy to manage Per Pharmacy      Review of Systems:  A 6 point ROS was negative except for GI: positive for pain    Objective:  Vital Signs: Last Filed                 Vital Signs: 24 Hour Range   BP: 126/80 (11/09 0718)  Temp: 36.8 ???C (98.2 ???F) (11/09 1914)  Pulse: 81 (11/09 0718)  Respirations: 18 PER MINUTE (11/09 0718)  SpO2: 95 % (11/09 0718)  O2 Delivery: None (Room Air) (11/09 0718)  SpO2 Pulse: 74 (11/08 1630)  Height: 160 cm (63) (11/08 1800) BP: (103-134)/(59-102)   Temp:  [36.8 ???C (98.2 ???F)-37.3 ???C (99.1 ???F)]   Pulse:  [67-86]   Respirations:  [8 PER MINUTE-18 PER MINUTE]   SpO2:  [92 %-99 %]   O2 Delivery: None (Room Air)   Intensity Pain Scale (Self Report): 6 (07/29/17 2037) Vitals:    07/29/17 0856 07/29/17 1800   Weight: 43.1 kg (95 lb) 43 kg (94 lb 11.2 oz)       Intake/Output Summary:  (Last 24 hours)    Intake/Output Summary (Last 24 hours) at 07/30/17 0850  Last data filed at 07/30/17 0700   Gross per 24 hour   Intake              240 ml   Output                0 ml   Net              240 ml           Physical Exam  Physical Exam   Constitutional: She is oriented to person, place, and time. She appears well-developed and well-nourished.   HENT:   Head: Normocephalic and atraumatic.   Mouth/Throat: Oropharynx is clear and moist. No oropharyngeal exudate. Eyes: Pupils are equal, round, and reactive to light. EOM are normal.   Neck: Normal range of motion. Neck supple. No JVD present. No tracheal deviation present. No thyromegaly present.   Cardiovascular: Normal rate, regular rhythm, normal heart sounds and intact distal pulses.    No murmur heard.  Pulmonary/Chest: Effort normal. No stridor. No respiratory distress. She has wheezes. She has no rales. She exhibits no tenderness.   Abdominal: Soft. Bowel sounds are normal. She exhibits no distension and no mass. There is tenderness. There is guarding. There is no rebound. No hernia.   Lymphadenopathy:     She has no cervical adenopathy.   Neurological: She is alert and oriented to person, place, and time.   Skin: Skin is warm and dry.   Psychiatric: She has a normal mood and affect. Her behavior is normal. Judgment and thought content normal.       Lab Review  24-hour labs:    Results for orders placed or performed during the hospital encounter of 07/29/17 (from the past 24 hour(s))   CULTURE-BLOOD W/SENSITIVITY    Collection Time: 07/29/17  1:20 PM   Result Value Ref Range    Battery Name BLOOD CULTURE     Specimen Description BLOOD  RIGHT  ANTECUBITAL       Special Requests NONE     Culture NO GROWTH 1 DAY     Report Status     CULTURE-BLOOD W/SENSITIVITY    Collection Time: 07/29/17  1:21 PM   Result Value Ref Range    Battery Name BLOOD CULTURE     Specimen Description BLOOD  LEFT  FA       Special Requests NONE     Culture NO GROWTH 1 DAY     Report Status  TOTAL PROTEIN-CSF    Collection Time: 07/29/17  6:50 PM   Result Value Ref Range    Total Protein,CSF 30 15 - 45 MG/DL   GLUCOSE-CSF    Collection Time: 07/29/17  6:50 PM   Result Value Ref Range    Glucose,CSF 35 (L) 40 - 75 MG/DL    Xanthrochromia,CSF NONE    CULTURE-CSF W/SENSITIVITY    Collection Time: 07/29/17  6:50 PM   Result Value Ref Range    Battery Name CSF CULTURE     Specimen Description CSF     Special Requests NONE     Direct Gram Stain FEW NEUTROPHILS  MANY  RBC'S  NO ORGANISMS SEEN       Culture NO GROWTH 1 DAY     Report Status     CELL COUNT W/DIFF-CSF    Collection Time: 07/29/17  6:50 PM   Result Value Ref Range    Cell Count Tube,CSF TUBE 4     White Blood Cells,CSF 12 (H) <5 /UL    Red Blood Cells,CSF 4,700 /UL    Neutrophils, CSF 87 %    Lymphocytes, CSF 9 %    Monocyte/Hisotocyte, CSF 4 %    Clarity,CSF SLT BLOODY     Path Interpretation, CSF HEMORRHAGIC FLUID  ACUTE INFLAMMATION       Pathologist Signature       INTERPRETED BY FRED PLAPP M.D.  By the PATH SIGNATURE ABOVE, I attest that I have personally formulated the   final interpretation expressed in this report and that the above diagnosis is   based upon my examination of the slides and/or other material indicated in this   report.     Romie Minus STAIN    Collection Time: 07/29/17  6:50 PM   Result Value Ref Range    Battery Name GRAM STAIN      Specimen Description CSF     Special Requests NONE     Gram Stain FEW  NEUTROPHILS  MANY  RBC'S  NO ORGANISMS SEEN       Report Status FINAL  07/29/2017      C REACTIVE PROTEIN (CRP)    Collection Time: 07/29/17  8:40 PM   Result Value Ref Range    C-Reactive Protein 1.20 (H) <1.0 MG/DL   SED RATE    Collection Time: 07/29/17  8:40 PM   Result Value Ref Range    Sed Rate -ESR 34 (H) 0 - 20 MM/HR   ALCOHOL LEVEL    Collection Time: 07/29/17  8:40 PM   Result Value Ref Range    Alcohol <10 MG/DL   CBC AND DIFF    Collection Time: 07/30/17  5:44 AM   Result Value Ref Range    White Blood Cells 12.4 (H) 4.5 - 11.0 K/UL    RBC 4.38 4.0 - 5.0 M/UL    Hemoglobin 10.8 (L) 12.0 - 15.0 GM/DL    Hematocrit 16.1 (L) 36 - 45 %    MCV 79.3 (L) 80 - 100 FL    MCH 24.5 (L) 26 - 34 PG    MCHC 30.9 (L) 32.0 - 36.0 G/DL    RDW 09.6 (H) 11 - 15 %    Platelet Count 535 (H) 150 - 400 K/UL    MPV 8.1 7 - 11 FL    Neutrophils 56 41 - 77 %    Lymphocytes 32 24 - 44 %    Monocytes 10 4 - 12 %    Eosinophils  1 0 - 5 %    Basophils 1 0 - 2 % Absolute Neutrophil Count 7.00 1.8 - 7.0 K/UL    Absolute Lymph Count 4.00 1.0 - 4.8 K/UL    Absolute Monocyte Count 1.20 (H) 0 - 0.80 K/UL    Absolute Eosinophil Count 0.10 0 - 0.45 K/UL    Absolute Basophil Count 0.10 0 - 0.20 K/UL   COMPREHENSIVE METABOLIC PANEL    Collection Time: 07/30/17  5:44 AM   Result Value Ref Range    Sodium 139 137 - 147 MMOL/L    Potassium 3.7 3.5 - 5.1 MMOL/L    Chloride 101 98 - 110 MMOL/L    Glucose 49 (LL) 70 - 100 MG/DL    Blood Urea Nitrogen 2 (L) 7 - 25 MG/DL    Creatinine 2.95 0.4 - 1.00 MG/DL    Calcium 8.5 8.5 - 62.1 MG/DL    Total Protein 5.5 (L) 6.0 - 8.0 G/DL    Total Bilirubin 0.3 0.3 - 1.2 MG/DL    Albumin 2.9 (L) 3.5 - 5.0 G/DL    Alk Phosphatase 308 (H) 25 - 110 U/L    AST (SGOT) 45 (H) 7 - 40 U/L    CO2 30 21 - 30 MMOL/L    ALT (SGPT) 59 (H) 7 - 56 U/L    Anion Gap 8 3 - 12    eGFR Non African American >60 >60 mL/min    eGFR African American >60 >60 mL/min   URINALYSIS DIPSTICK REFLEX TO CULTURE    Collection Time: 07/30/17  5:55 AM   Result Value Ref Range    Color,UA YELLOW     Turbidity,UA CLEAR CLEAR-CLEAR    Specific Gravity-Urine 1.010 1.003 - 1.035    pH,UA 8.0 5.0 - 8.0    Protein,UA NEG NEG-NEG    Glucose,UA NEG NEG-NEG    Ketones,UA TRACE (A) NEG-NEG    Bilirubin,UA NEG NEG-NEG    Blood,UA NEG NEG-NEG    Urobilinogen,UA NORMAL NORM-NORMAL    Nitrite,UA NEG NEG-NEG    Leukocytes,UA NEG NEG-NEG    Urine Ascorbic Acid, UA NEG NEG-NEG   URINALYSIS MICROSCOPIC REFLEX TO CULTURE    Collection Time: 07/30/17  5:55 AM   Result Value Ref Range    WBCs,UA 0-2 0 - 2 /HPF    RBCs,UA 0-2 0 - 3 /HPF    Comment,UA       Urine submitted for reflex culture if criteria are met:WBC>10, positive nitrite   and/or >=1+ leukocyte esterase. If quantity is not sufficient, an addendum will   follow.      Squamous Epithelial Cells 0-2 0 - 5   COCAINE-URINE RANDOM    Collection Time: 07/30/17  5:55 AM   Result Value Ref Range    Cocaine-Urine NEG NEG-NEG   CANNABINOIDS-URINE RANDOM Collection Time: 07/30/17  5:55 AM   Result Value Ref Range    THC NEG NEG-NEG   BENZODIAZEPINES-URINE RANDOM    Collection Time: 07/30/17  5:55 AM   Result Value Ref Range    Benzodiazepines NEG NEG-NEG   AMPHETAMINES-URINE RANDOM    Collection Time: 07/30/17  5:55 AM   Result Value Ref Range    Amphetamines NEG NEG-NEG   POC GLUCOSE    Collection Time: 07/30/17  7:10 AM   Result Value Ref Range    Glucose, POC 60 (L) 70 - 100 MG/DL   POC GLUCOSE    Collection Time: 07/30/17  7:28 AM   Result Value Ref Range  Glucose, POC 104 (H) 70 - 100 MG/DL   POC GLUCOSE    Collection Time: 07/30/17  8:47 AM   Result Value Ref Range    Glucose, POC 88 70 - 100 MG/DL       Point of Care Testing  (Last 24 hours)  Glucose: (!) 49 (Critical Value GLU: Called To: ADAM C at: 07:04:59 by: KRO Read back by: ADAM   C  ) (07/30/17 0544)  POC Glucose (Download): 88 (07/30/17 0847)  Urine Pregnancy: (S) Negative (07/29/17 4540)    Radiology and other Diagnostics Review:    Pertinent radiology reviewed.    Windy Kalata   Pager

## 2017-07-30 NOTE — Consults
Gastroenterology Consult Note  Patient Name:Alyssa Cochran         ZOX:[id number]  Admission Date: 07/29/2017  8:50 AM      Active Problems:    Seizure [facility])      Reason for consult:  Concern for Crohns disease, admitted for seizures    Assessment:  Alyssa Cochran is a 36 y.o. female with history of Crohn's disease who was admitted for a first time seizure after a fall.  She has been experiencing fever for about a week prior to admission, some neck stiffness and altered mental status associated with photophobia and phonophobia and is being evaluated for meningitis and other possible infections.    We have been consulted because she had been seen in the GI clinic previously and she was supposed to undergo workup for evaluation of Crohn's disease and malnutrition which was planned over a year ago and has not been completed yet due to insurance issues.    # abdominal distention and pain, hematochezia, diarrhea and nausea  # weight loss, anorexia, malnourishment  # Concern for Crohn's disease, unclear diagnosis (patient has been worked up at [facility], CRP 1.2  # CT findings 07/29/17 consistent with ileocolitis w/ TI and ascending colon wall thickening  # s/p partial small bowel resection ? in 2017  # Microcytic anemia  # has been on chronic steroids 40 mg po prednisone daily for >1 year, unclear which doctor has been prescribing  # Patient appears dehydrated, given that she is currently not eating or tolerating p.o. intake, may consider maintenance fluids    Recommendations:  - - considering her elevated LFT's and FTT, please also check INR, also please check MRCP  - Currently given the high suspicion for possible infection and her acute neurologic problems, and stable hemoglobin, there is no acute indication to do colonoscopy, we will reassess for endoscopic evaluation next week  - Given her chronic prednisone use, the steroids will need to be tapered off - Check iron studies, Vit B 12, folate, Vit A, Vit D, B6, B1, zinc, selenium levels  - Once her acute neurologic problems or infections are resolving, consider nutrition consult and do calorie counts  - Obtain operative report of her intestinal surgery (including pathology results and imaging findings)  - She has been prescribed prednisone by a GI doctor,?  At [facility], please obtain records  - Patient currently reports having been constipated for the past couple of days, we will needed aggressive bowel regimen (MiraLAX twice daily, senna at bedtime), if this is not successful we can do 2 L GoLYTELY (if not tolerated due to nausea, patient agrees to place an NG tube and GoLYTELY to be given through the NG tube)  - start on maintenance fluids  - Patient will need bone density scan (either as inpatient or upon discharge)  - We will follow-up in clinic closely after her discharge    Patient discussed with attending on service, Dr. Burnis Medin.    Renea Ee, MD  Gastroenterology & Hepatology Fellow  Pager [phone]      -----------------------------  HPI/Subjective:  Alyssa Cochran Patient is a 36 y.o. female with history of Crohn's disease who was admitted for a first time seizure after a fall.  She has been experiencing fever for about a week prior to admission, some neck stiffness and altered mental status associated with photophobia and phonophobia and is being evaluated for meningitis and other possible infections.    We have been  consulted because she had been seen in the GI clinic previously and she was supposed to undergo workup for evaluation of Crohn's disease and malnutrition which was planned over a year ago and has not been completed yet due to insurance issues.    Chronically before she got acutely ill, she was complaining of abdominal distention and pain, hematochezia, diarrhea and nausea for the past 2-3 years.  She reports crampy right upper quadrant and flank pain that is there every day 8 out of 10, not associated with food intake, no alleviating factors, the pain is nonradiating.  She reports she has usually bowel movements every day that are soft to loose and mixed in with little amounts of blood 2-3 times per week.  She is currently not nauseous, but feels queasy.    She reports that her father has IBD, she does not know whether Crohn's or ulcerative colitis.    Social history:  She smokes about 1 pack/day, does not drink any alcohol, no recreational drug use    Surgical history:  Status post cholecystectomy, tubal ligation  s/p partial small bowel resection 2017    CT AP w/ contrast 07/29/17:   A left lower quadrant small bowel anastomosis is noted. No dilated loops of bowel are seen to suggest obstruction. The terminal ileum is incompletely distended with apparent wall thickening and inner wall enhancement. There is mild inner wall enhancement of the ascending colon. Appendix is normal. No ascites. No mesenteric adenopathy.  Diffuse hepatic steatosis.    Prior endoscopies:  Flexible sigmoidoscopy 07/2015 Abdominal pain in the left upper quadrant, Diarrhea -erythematous mucosa in the proximal rectum, biopsied for IBD  Colonoscopy 04/2014 for abdominal pain, change in bowel habits, and weight loss -erythematous mucosa in the sigmoid colon, and the descending colon, at the splenic flexure and the transverse colon, which was biopsied, examined portion of the ileum was normal, biopsied.  EGD 04/2014 Generalized abdominal pain, Nausea with vomiting, Weight loss -normal findings on endoscopy, biopsied duodenum, stomach, esophagus    CT enterography with contrast in September 2015 did not show any discrete acute abdominal or pelvic pathology.  At that time patient has been on prednisone for 2 weeks.    Gastric emptying study in 2017 showed rapid progression of radiotracer from the stomach into the small bowel (after 1 hour only 7% of radiotracer was in the stomach)    PMH: Past Medical History:   Diagnosis Date   ??? Back pain    ??? Heart disease    ??? Stomach pain        Current medications:  No current facility-administered medications on file prior to encounter.      Current Outpatient Prescriptions on File Prior to Encounter   Medication Sig Dispense Refill   ??? citalopram (CELEXA) 10 mg tablet Take 60 mg by mouth daily.     ??? electrolyte GUT PEG (NULYTELY, COLYTE, GAVILYTE-N) 420 gram oral solution Mix as directed on package. Drink (8oz) every 10 minutes until gone. Refrigerate once mixed. 4000 mL 0   ??? HYDROcodone/acetaminophen (NORCO) 5/325 mg tablet Take 1 tablet by mouth every 4 hours as needed for Pain 20 tablet 0   ??? POTASSIUM (POTASSIMIN PO) Take 40 mEq by mouth daily.     ??? PREDNISONE PO Take 40 mg by mouth daily.     ??? traMADol (ULTRAM) 50 mg tablet Take 1 tablet by mouth every 6 hours as needed for Pain. Indications: PAIN 20 tablet 0  Past Surgical History:   Procedure Laterality Date   ??? COLON SURGERY     ??? TUBAL LIGATION       Social History     Social History   ??? Marital status: Married     Spouse name: N/A   ??? Number of children: N/A   ??? Years of education: N/A     Occupational History   ??? Not on file.     Social History Main Topics   ??? Smoking status: Current Every Day Smoker     Packs/day: 1.00     Years: 15.00   ??? Smokeless tobacco: Never Used   ??? Alcohol use No   ??? Drug use: No   ??? Sexual activity: Not on file     Other Topics Concern   ??? Not on file     Social History Narrative   ??? No narrative on file     Family History   Problem Relation Age of Onset   ??? COPD Mother    ??? Arthritis Mother    ??? Heart Disease Father        Review of Systems:  General: No fevers, chills, weight loss or gain, B-symptoms.  Oropharynx: No lesion, ulcer.  Neck: No pain or decreased ROM.  Pulm: No dyspnea, cough.  CV: No palpitations, no chest pain.  Abdomen: No abdominal pain, brown stool, no changes in bowel habits.  Extremities: No swelling, no deformity. Heme: No for easy bleeding or bruising.  Neuro: No lightheadedness/dizziness, no LOC.  Skin: No rashes, no jaundice.  Please see HPI for additional pertinent documentation    Physical Exam:  Vitals:    07/29/17 2122 07/29/17 2220 07/29/17 2345 07/30/17 0718   BP: 118/64  105/59 126/80   Pulse: 80 72 67 81   Temp: 37.3 ???C (99.1 ???F)  37.1 ???C (98.7 ???F) 36.8 ???C (98.2 ???F)   SpO2: 92% 93% 93% 95%   Weight:       Height:         General - Alert and oriented, no acute distress.   Head - Normocephalic, atraumatic.   Eyes -  EOMI grossly. No icterus or injection.   Oropharynx- No ulcer or bleeding, moist mucosa.  Neck - No swelling or tracheal deviation.   Pulmonary/Chest - Normal effort, normal breath sounds.  CV - Normal rate, regular rhythm, normal heart sounds and intact distal pulses.  Abd - ND, soft, Non-TTP. No hepatospenomegaly. Normal bowel sounds. No masses palpable.  Extremities - Warm, dry.   Skin - No exposed rash, lesion.  Neurological - AAOx4, No gross deficit    Labs/Imaging:  Pertient labs/imaging was reviewed on initiation of progress note.   24-hour labs:    Results for orders placed or performed during the hospital encounter of 07/29/17 (from the past 24 hour(s))   CBC AND DIFF    Collection Time: 07/29/17  9:18 AM   Result Value Ref Range    White Blood Cells 20.4 (H) 4.5 - 11.0 K/UL    RBC 4.54 4.0 - 5.0 M/UL    Hemoglobin 11.0 (L) 12.0 - 15.0 GM/DL    Hematocrit 16.1 36 - 45 %    MCV 79.5 (L) 80 - 100 FL    MCH 24.3 (L) 26 - 34 PG    MCHC 30.6 (L) 32.0 - 36.0 G/DL    RDW 09.6 (H) 11 - 15 %    Platelet Count 457 (H) 150 - 400 K/UL  MPV 8.4 7 - 11 FL    Neutrophils 76 41 - 77 %    Lymphocytes 18 (L) 24 - 44 %    Monocytes 6 4 - 12 %    Eosinophils 0 0 - 5 %    Basophils 0 0 - 2 %    Absolute Neutrophil Count 15.30 (H) 1.8 - 7.0 K/UL    Absolute Lymph Count 3.70 1.0 - 4.8 K/UL    Absolute Monocyte Count 1.20 (H) 0 - 0.80 K/UL    Absolute Eosinophil Count 0.10 0 - 0.45 K/UL Absolute Basophil Count 0.10 0 - 0.20 K/UL   COMPREHENSIVE METABOLIC PANEL    Collection Time: 07/29/17  9:18 AM   Result Value Ref Range    Sodium 134 (L) 137 - 147 MMOL/L    Potassium 3.9 3.5 - 5.1 MMOL/L    Chloride 96 (L) 98 - 110 MMOL/L    Glucose 61 (L) 70 - 100 MG/DL    Blood Urea Nitrogen <2 (L) 7 - 25 MG/DL    Creatinine 6.43 0.4 - 1.00 MG/DL    Calcium 8.7 8.5 - 32.9 MG/DL    Total Protein 6.3 6.0 - 8.0 G/DL    Total Bilirubin 0.2 (L) 0.3 - 1.2 MG/DL    Albumin 3.4 (L) 3.5 - 5.0 G/DL    Alk Phosphatase 518 (H) 25 - 110 U/L    AST (SGOT) 127 (H) 7 - 40 U/L    CO2 29 21 - 30 MMOL/L    ALT (SGPT) 89 (H) 7 - 56 U/L    Anion Gap 9 3 - 12    eGFR Non African American >60 >60 mL/min    eGFR African American >60 >60 mL/min   MAGNESIUM    Collection Time: 07/29/17  9:18 AM   Result Value Ref Range    Magnesium 1.9 1.6 - 2.6 mg/dL   POC GLUCOSE    Collection Time: 07/29/17  9:20 AM   Result Value Ref Range    Glucose, POC 67 (L) 70 - 100 MG/DL   URINALYSIS DIPSTICK    Collection Time: 07/29/17  9:38 AM   Result Value Ref Range    Color,UA YELLOW     Turbidity,UA 1+ (A) CLEAR-CLEAR    Specific Gravity-Urine 1.013 1.003 - 1.035    pH,UA 6.0 5.0 - 8.0    Protein,UA NEG NEG-NEG    Glucose,UA NEG NEG-NEG    Ketones,UA NEG NEG-NEG    Bilirubin,UA NEG NEG-NEG    Blood,UA NEG NEG-NEG    Urobilinogen,UA NORMAL NORM-NORMAL    Nitrite,UA NEG NEG-NEG    Leukocytes,UA NEG NEG-NEG    Urine Ascorbic Acid, UA NEG NEG-NEG   URINALYSIS, MICROSCOPIC    Collection Time: 07/29/17  9:38 AM   Result Value Ref Range    WBCs,UA 0-2 0 - 2 /HPF    RBCs,UA 0-2 0 - 3 /HPF    MucousUA TRACE     Bacteria,UA FEW (A) NEG-NEG    Squamous Epithelial Cells 10-20 0 - 5    Hyaline Cast 5-10    CULTURE-BLOOD W/SENSITIVITY    Collection Time: 07/29/17  1:20 PM   Result Value Ref Range    Battery Name BLOOD CULTURE     Specimen Description BLOOD  RIGHT  ANTECUBITAL       Special Requests NONE     Culture NO GROWTH 1 DAY     Report Status CULTURE-BLOOD W/SENSITIVITY    Collection Time: 07/29/17  1:21 PM  Result Value Ref Range    Battery Name BLOOD CULTURE     Specimen Description BLOOD  LEFT  FA       Special Requests NONE     Culture NO GROWTH 1 DAY     Report Status     TOTAL PROTEIN-CSF    Collection Time: 07/29/17  6:50 PM   Result Value Ref Range    Total Protein,CSF 30 15 - 45 MG/DL   GLUCOSE-CSF    Collection Time: 07/29/17  6:50 PM   Result Value Ref Range    Glucose,CSF 35 (L) 40 - 75 MG/DL    Xanthrochromia,CSF NONE    CULTURE-CSF W/SENSITIVITY    Collection Time: 07/29/17  6:50 PM   Result Value Ref Range    Battery Name CSF CULTURE     Specimen Description CSF     Special Requests NONE     Direct Gram Stain FEW  NEUTROPHILS  MANY  RBC'S  NO ORGANISMS SEEN       Culture NO GROWTH 1 DAY     Report Status     CELL COUNT W/DIFF-CSF    Collection Time: 07/29/17  6:50 PM   Result Value Ref Range    Cell Count Tube,CSF TUBE 4     White Blood Cells,CSF 12 (H) <5 /UL    Red Blood Cells,CSF 4,700 /UL    Neutrophils, CSF 87 %    Lymphocytes, CSF 9 %    Monocyte/Hisotocyte, CSF 4 %    Clarity,CSF SLT BLOODY     Path Interpretation, CSF      Pathologist Signature     GRAM STAIN    Collection Time: 07/29/17  6:50 PM   Result Value Ref Range    Battery Name GRAM STAIN      Specimen Description CSF     Special Requests NONE     Gram Stain FEW  NEUTROPHILS  MANY  RBC'S  NO ORGANISMS SEEN       Report Status FINAL  07/29/2017      C REACTIVE PROTEIN (CRP)    Collection Time: 07/29/17  8:40 PM   Result Value Ref Range    C-Reactive Protein 1.20 (H) <1.0 MG/DL   SED RATE    Collection Time: 07/29/17  8:40 PM   Result Value Ref Range    Sed Rate -ESR 34 (H) 0 - 20 MM/HR   ALCOHOL LEVEL    Collection Time: 07/29/17  8:40 PM   Result Value Ref Range    Alcohol <10 MG/DL   CBC AND DIFF    Collection Time: 07/30/17  5:44 AM   Result Value Ref Range    White Blood Cells 12.4 (H) 4.5 - 11.0 K/UL    RBC 4.38 4.0 - 5.0 M/UL Hemoglobin 10.8 (L) 12.0 - 15.0 GM/DL    Hematocrit 18.2 (L) 36 - 45 %    MCV 79.3 (L) 80 - 100 FL    MCH 24.5 (L) 26 - 34 PG    MCHC 30.9 (L) 32.0 - 36.0 G/DL    RDW 99.3 (H) 11 - 15 %    Platelet Count 535 (H) 150 - 400 K/UL    MPV 8.1 7 - 11 FL    Neutrophils 56 41 - 77 %    Lymphocytes 32 24 - 44 %    Monocytes 10 4 - 12 %    Eosinophils 1 0 - 5 %    Basophils 1 0 - 2 %  Absolute Neutrophil Count 7.00 1.8 - 7.0 K/UL    Absolute Lymph Count 4.00 1.0 - 4.8 K/UL    Absolute Monocyte Count 1.20 (H) 0 - 0.80 K/UL    Absolute Eosinophil Count 0.10 0 - 0.45 K/UL    Absolute Basophil Count 0.10 0 - 0.20 K/UL   COMPREHENSIVE METABOLIC PANEL    Collection Time: 07/30/17  5:44 AM   Result Value Ref Range    Sodium 139 137 - 147 MMOL/L    Potassium 3.7 3.5 - 5.1 MMOL/L    Chloride 101 98 - 110 MMOL/L    Glucose 49 (LL) 70 - 100 MG/DL    Blood Urea Nitrogen 2 (L) 7 - 25 MG/DL    Creatinine 9.60 0.4 - 1.00 MG/DL    Calcium 8.5 8.5 - 45.4 MG/DL    Total Protein 5.5 (L) 6.0 - 8.0 G/DL    Total Bilirubin 0.3 0.3 - 1.2 MG/DL    Albumin 2.9 (L) 3.5 - 5.0 G/DL    Alk Phosphatase 098 (H) 25 - 110 U/L    AST (SGOT) 45 (H) 7 - 40 U/L    CO2 30 21 - 30 MMOL/L    ALT (SGPT) 59 (H) 7 - 56 U/L    Anion Gap 8 3 - 12    eGFR Non African American >60 >60 mL/min    eGFR African American >60 >60 mL/min   URINALYSIS DIPSTICK REFLEX TO CULTURE    Collection Time: 07/30/17  5:55 AM   Result Value Ref Range    Color,UA YELLOW     Turbidity,UA CLEAR CLEAR-CLEAR    Specific Gravity-Urine 1.010 1.003 - 1.035    pH,UA 8.0 5.0 - 8.0    Protein,UA NEG NEG-NEG    Glucose,UA NEG NEG-NEG    Ketones,UA TRACE (A) NEG-NEG    Bilirubin,UA NEG NEG-NEG    Blood,UA NEG NEG-NEG    Urobilinogen,UA NORMAL NORM-NORMAL    Nitrite,UA NEG NEG-NEG    Leukocytes,UA NEG NEG-NEG    Urine Ascorbic Acid, UA NEG NEG-NEG   URINALYSIS MICROSCOPIC REFLEX TO CULTURE    Collection Time: 07/30/17  5:55 AM   Result Value Ref Range    WBCs,UA 0-2 0 - 2 /HPF RBCs,UA 0-2 0 - 3 /HPF    Comment,UA       Urine submitted for reflex culture if criteria are met:WBC>10, positive nitrite   and/or >=1+ leukocyte esterase. If quantity is not sufficient, an addendum will   follow.      Squamous Epithelial Cells 0-2 0 - 5   COCAINE-URINE RANDOM    Collection Time: 07/30/17  5:55 AM   Result Value Ref Range    Cocaine-Urine NEG NEG-NEG   CANNABINOIDS-URINE RANDOM    Collection Time: 07/30/17  5:55 AM   Result Value Ref Range    THC NEG NEG-NEG   BENZODIAZEPINES-URINE RANDOM    Collection Time: 07/30/17  5:55 AM   Result Value Ref Range    Benzodiazepines NEG NEG-NEG   AMPHETAMINES-URINE RANDOM    Collection Time: 07/30/17  5:55 AM   Result Value Ref Range    Amphetamines NEG NEG-NEG   POC GLUCOSE    Collection Time: 07/30/17  7:10 AM   Result Value Ref Range    Glucose, POC 60 (L) 70 - 100 MG/DL   POC GLUCOSE    Collection Time: 07/30/17  7:28 AM   Result Value Ref Range    Glucose, POC 104 (H) 70 - 100 MG/DL  Pertinent radiology reviewed.

## 2017-07-30 NOTE — Progress Notes
Tech performed routine EEG in patient room without complications.

## 2017-07-30 NOTE — Patient Education
Medication Education    Alyssa Cochran accepted counseling and was engaged.  she verbalized understanding.    The following medications were discussed:  Ceftriaxone, Hydrocodone, lactated ringers, Vancomycin, Zosyn, Tramadol.    Where indicated, the patient was provided with additional medication and/or disease-state information.  All patient questions were answered and patient acknowledged understanding of the medications, side effects and other pertinent medication information.    Follow up should occur as needed.    Continue to address: indications    Norwood Levo, RN

## 2017-07-30 NOTE — Consults
CLINICAL NUTRITION                                                        Clinical Nutrition Assessment Summary     NAME:Alyssa Cochran             MRN: [id number]             DOB:04/05/1981          AGE: 36 y.o.  ADMISSION DATE: 07/29/2017             DAYS ADMITTED: LOS: 1 day    Nutrition Assessment of Patient:  BMI Categories Adult: Under Weight: < 18.5  Malnutrition Assessment: Does not meet criteria (without full physcial assessment)  Current Oral Intake:  (not eating per RN, pt did not answer)  Estimated Calorie Needs: 1290-1419 (30-33 kcals/kg Admit WT 43kg)  Estimated Protein Needs: 65g (1.5 g/kg Admit WT 43kg)  Oral Diet Order: Regular       Comments:  Alyssa Cochran is a 36 y/o F with a pmh of Crohn's, depressionwho was admitted for a first time seizure after a fall. Pt lying in bed sleeping, reported OK for RD to interview although she did fall back to sleep about 1/2way thorugh. Pt reports UBW 80-90#, last at this wt a few months ago, since then has been a little higher (Admit WT 97.4#). Appetite has been low the past few months but pt reports this has not affected her intake much as she has been eating through it, 3 meals per day. C/o N/V, denies diarrhea and constipation, reports stooling 1-2x/day. Per RN pt is not eating and said she wants to leave AMA. RD unable to complete physical assessment as pt fell asleep/stopped responding to questions, not able to diagnose pt with malnutrition at this time 2/2 incomplete physical assessment, pt reports adequate PO intake PTA and wt higher than usual.    Recommendation:  Continue Regular diet as tolerated. Please encourage PO intake including oral supplements/shakes available on unit.                                Intervention / Plan:  Assess nutrition status and needs  Will monitor PO intake/tolerance, wt, labs, meds, POC       Nutrition Diagnosis:  Underweight  Etiology: suspected Hx poor PO intake  Signs & Symptoms: BMI 16.78                      Goals: Patient to consume >50% of meals  Time Frame: Within 72 Hours                 Cyndi Lennert, MS, RD, LD  [phone]

## 2017-07-30 NOTE — Patient Education
Medication Education    Alyssa Cochran accepted counseling and was interactive.  she verbalized understanding.    The following medications were discussed:  Rocephin  Norco  Vancomycin  Celexa    Where indicated, the patient was provided with additional medication and/or disease-state information.  All patient questions were answered and patient acknowledged understanding of the medications, side effects and other pertinent medication information.    Follow up should occur daily.    Continue to address: indications    Regenia Skeeter, RN

## 2017-07-30 NOTE — Progress Notes
Neurology Consultation      Name:  Alyssa Cochran                                             MRN:  [id number]   Admission Date:  07/29/2017  LOS: 1 day  [id number]                 ASSESSMENT:    Active Problems:    Seizure (HCC)    Consult type: Opinion with orders    Alyssa Cochran is a 36 y.o.  female with PMH of generalized anxiety disorder, depression and chronic abdominal pain that neurology is consulted to evaluate for possible seizures.     Neuro exam reveals improved mentation today, no abnormalities otherwise      Imaging/Diagnostic Studies:  Hgb 10.8 with MCV 79.3 and RDW 19.6  WBC 20K->12K  Glucose 61->49  AST 127->45  ALT 89->59   ALK Phos 221->181  ESR 34  CRP 1.20    LP results:   WBC 12  RBC 4700  Glu 35  Protein 30  HSV 1/2 PCR Not detected   Gram stain: PMN's, no organisms     CT Head: No acute abnormality  CT C spine: No acute fracture   CT Abd/Pelvis: Apparent wall thickening and enhancement of the terminal ileum as well as mild enhancement of the ascending colon concerning for inflammatory enterocolitis given the history of Crohn's disease.   EEG: Generalized delta slowing consistent with mild encephalopathy, no epileptiform discharges seen     Impression:   1. Provoked Generalized Seizure   Improvement in mentation today. EEG showing evidence of a mild encephalopathy without evidence of ongoing epileptic activity. Given chronic steroid use, there is concern for atypical infection of the CNS with manifestation of this as a seizure, headache, neck stiffness and somnolence. Currently receiving Vancomycin/Rocephin/Ampicillin. ID on board. LP results with elevated WBC (in face of elevated RBC/chronic steroid use/post-ictal), low glucose (serum glucose low) and normal total protein. A blunted immune response in setting of an underlying bacterial infection is a concern. Given neurologic status of the patient being relatively intact, suspicion for this type of infection is low but favor to cover her with empiric antibiotics at this time. A viral etiology is also a possibility. The majority of therapy in aseptic meningitis is supportive and HSV was not found in the CSF. Fungal etiology must also be considered. MRI brain pending, will follow up results.     At this time, current leading hypothesis is patient had a provoked seizure in the setting of numerous factors including: malnutrition, insomnia, bowel inflammation/infection. No AED is indicated at this time.       2. Chronic Abdominal Pain, Prior Intussusception, Concern for Crohn's Disease   2 year history of recurrent abdominal pains, bowel changes, ex lap requiring small bowel resection due to intussusception. Prior EGD/Colonoscopy. Evidence of focal bowel inflammation on outside records. Carrying diagnosis of Crohn's disease however this has not been confirmed with GI here at [facility]. CT Abdomen with focal bowel inflammation which certainly is suggestive of a Crohn's Flare. GI has been consulted. Patient has been taking 40 mg Prednisone daily for some time making her immunocompromised. GI pursuing malnutrition workup. Continue to consider Crohn's disease.     3. Dehydration/Poor Nutritional Status   Patient reports 25 pound weight loss since onset of  her abdominal pain. Wt loss ~125->95 pounds in two years. Dr mucous membranes on examination and emaciated appearance. Would benefit from nutritional assessment and concern for malabsorptive process.     RECOMMENDATIONS:  > Agree with empiric coverage for bacterial meningitis  > Will follow up results of MRI Brain   > No anti-epileptic medication indicated at this time     Neurology will continue to follow.       Patient seen and plan of care discussed with Dr. Alvester Morin and communicated to primary team.  __________________________________________________________________________________    Subjective: No events overnight. Patient reports some improvement in her mentation and abdominal pain today. She reports still feeling very fatigued. She denies vision changes, weakness, numbness/tingling, problems with speech. Plan of care discussed.      Past Medical History:   Diagnosis Date   ??? Back pain    ??? Heart disease    ??? Stomach pain      Past Surgical History:   Procedure Laterality Date   ??? COLON SURGERY     ??? TUBAL LIGATION       Social History     Social History   ??? Marital status: Married     Spouse name: N/A   ??? Number of children: N/A   ??? Years of education: N/A     Social History Main Topics   ??? Smoking status: Current Every Day Smoker     Packs/day: 1.00     Years: 15.00   ??? Smokeless tobacco: Never Used   ??? Alcohol use No   ??? Drug use: No   ??? Sexual activity: Not on file     Other Topics Concern   ??? Not on file     Social History Narrative   ??? No narrative on file      Family History   Problem Relation Age of Onset   ??? COPD Mother    ??? Arthritis Mother    ??? Heart Disease Father        Immunizations (includes history and patient reported):   There is no immunization history on file for this patient.        Allergies:  Patient has no known allergies.    Medications:  Prescriptions Prior to Admission   Medication Sig   ??? albuterol 0.083% (PROVENTIL; VENTOLIN) 2.5 mg /3 mL (0.083 %) nebulizer solution Inhale 3 mL solution by nebulizer as directed every 6 hours as needed for Wheezing or Shortness of Breath.   ??? brexpiprazole 1 mg tab Take 1 tablet by mouth daily.   ??? citalopram (CELEXA) 40 mg tablet Take 40 mg by mouth daily.   ??? electrolyte GUT PEG (NULYTELY, COLYTE, GAVILYTE-N) 420 gram oral solution Mix as directed on package. Drink (8oz) every 10 minutes until gone. Refrigerate once mixed.   ??? fluticasone (FLONASE) 50 mcg/actuation nasal spray Apply 1 spray to each nostril as directed daily. Shake bottle gently before using.   ??? HYDROcodone/acetaminophen (NORCO) 5/325 mg tablet Take 1 tablet by mouth every 4 hours as needed for Pain   ??? ondansetron (ZOFRAN ODT) 4 mg rapid dissolve tablet Dissolve 4 mg by mouth every 8 hours as needed for Nausea or Vomiting. Place on tongue to disolve.   ??? pantoprazole DR (PROTONIX) 40 mg tablet Take 40 mg by mouth daily.   ??? potassium chloride(+) (MICRO-K) 10 mEq capsule Take 20 mEq by mouth daily. Take with a meal and a full glass of water.   ??? prednisone (DELTASONE)  10 mg tablet Take 40 mg by mouth daily.   ??? traMADol (ULTRAM) 50 mg tablet Take 1 tablet by mouth every 6 hours as needed for Pain. Indications: PAIN   ??? zolpidem (AMBIEN) 10 mg tablet Take 10 mg by mouth at bedtime as needed for Sleep.     No current facility-administered medications on file prior to encounter.      Current Outpatient Prescriptions on File Prior to Encounter   Medication Sig Dispense Refill   ??? citalopram (CELEXA) 40 mg tablet Take 40 mg by mouth daily.     ??? electrolyte GUT PEG (NULYTELY, COLYTE, GAVILYTE-N) 420 gram oral solution Mix as directed on package. Drink (8oz) every 10 minutes until gone. Refrigerate once mixed. 4000 mL 0   ??? HYDROcodone/acetaminophen (NORCO) 5/325 mg tablet Take 1 tablet by mouth every 4 hours as needed for Pain 20 tablet 0   ??? prednisone (DELTASONE) 10 mg tablet Take 40 mg by mouth daily.     ??? traMADol (ULTRAM) 50 mg tablet Take 1 tablet by mouth every 6 hours as needed for Pain. Indications: PAIN 20 tablet 0     Review of Systems:  Negative except as noted per HPI     Physical Exam:  Vital Signs: Last Filed In 24 Hours Vital Signs: 24 Hour Range   BP: 128/81 (11/09 1159)  Temp: 37 ???C (98.6 ???F) (11/09 1159)  Pulse: 69 (11/09 1159)  Respirations: 18 PER MINUTE (11/09 1159)  SpO2: 93 % (11/09 1159)  O2 Delivery: None (Room Air) (11/09 1159)  SpO2 Pulse: 74 (11/08 1630)  Height: 160 cm (63) (11/08 1800) BP: (105-128)/(59-81)   Temp:  [36.8 ???C (98.2 ???F)-37.3 ???C (99.1 ???F)]   Pulse:  [67-81]   Respirations:  [13 PER MINUTE-18 PER MINUTE]   SpO2:  [92 %-95 %] O2 Delivery: None (Room Air)   Intensity Pain Scale (Self Report): Asleep (07/30/17 1035)      HEENT: Right posterior scalp hematoma from recent fall, normocephalic, eyes open with no discharge, nares patent, oropharynx is clear with no lesions, palate intact, no bruits, dry mucous membranes   Chest: normal configuration, non labored breathing, chest rise equal b/l   CV: normal rate and regular rhythm, no murmur, distal pulses palpable  Pulmonary: lungs are clear bilaterally, no wheezes/crackles/rales  Abd: soft, non- tender, no masses, no organomegaly  Skin: no rashes or lesions  Psych: normal      Neuro:  Mental status: Oriented to person/place/time/situation  Memory: Recent and remote memory intact  Attention: Intact    Fund of knowledge: Low   Level of consciousness: Improved but continues to be somnolent    Speech:    Fluency: Normal   Comprehension: Normal    Articulation: Normal   Repetition: Normal   Naming: Normal  CN II-XII: grossly intact   Motor: Normal tone and bulk. No abnormal movement, fasciculation or pronator drift. Some give away weakness noted throughout but able to afford full strength initially. Strength intact throughout.   Sensory: Grossly intact   Cerebellar Funciton/fine movement: normal finger to nose, heel to shin, finger tapping, RAMs.  Gait: deferred due to patient request     Lab/Radiology/Other Diagnostic Tests:  24-hour labs:    Results for orders placed or performed during the hospital encounter of 07/29/17 (from the past 24 hour(s))   TOTAL PROTEIN-CSF    Collection Time: 07/29/17  6:50 PM   Result Value Ref Range    Total Protein,CSF 30 15 - 45  MG/DL   GLUCOSE-CSF    Collection Time: 07/29/17  6:50 PM   Result Value Ref Range    Glucose,CSF 35 (L) 40 - 75 MG/DL    Xanthrochromia,CSF NONE    CULTURE-CSF W/SENSITIVITY    Collection Time: 07/29/17  6:50 PM   Result Value Ref Range    Battery Name CSF CULTURE     Specimen Description CSF     Special Requests NONE Direct Gram Stain FEW  NEUTROPHILS  MANY  RBC'S  NO ORGANISMS SEEN       Culture NO GROWTH 1 DAY     Report Status     CELL COUNT W/DIFF-CSF    Collection Time: 07/29/17  6:50 PM   Result Value Ref Range    Cell Count Tube,CSF TUBE 4     White Blood Cells,CSF 12 (H) <5 /UL    Red Blood Cells,CSF 4,700 /UL    Neutrophils, CSF 87 %    Lymphocytes, CSF 9 %    Monocyte/Hisotocyte, CSF 4 %    Clarity,CSF SLT BLOODY     Path Interpretation, CSF HEMORRHAGIC FLUID  ACUTE INFLAMMATION       Pathologist Signature       INTERPRETED BY FRED PLAPP M.D.  By the PATH SIGNATURE ABOVE, I attest that I have personally formulated the   final interpretation expressed in this report and that the above diagnosis is   based upon my examination of the slides and/or other material indicated in this   report.     Romie Minus STAIN    Collection Time: 07/29/17  6:50 PM   Result Value Ref Range    Battery Name GRAM STAIN      Specimen Description CSF     Special Requests NONE     Gram Stain FEW  NEUTROPHILS  MANY  RBC'S  NO ORGANISMS SEEN       Report Status FINAL  07/29/2017      HERPES SIMPLEX PCR - NON-BLOOD    Collection Time: 07/29/17  6:50 PM   Result Value Ref Range    Specimen, Herpes CSF     Herpes Simplex PCR HSV 1 and 2 NOT DETECTED HSVND-HSV 1 and 2 NOT DETECTED   C REACTIVE PROTEIN (CRP)    Collection Time: 07/29/17  8:40 PM   Result Value Ref Range    C-Reactive Protein 1.20 (H) <1.0 MG/DL   SED RATE    Collection Time: 07/29/17  8:40 PM   Result Value Ref Range    Sed Rate -ESR 34 (H) 0 - 20 MM/HR   ALCOHOL LEVEL    Collection Time: 07/29/17  8:40 PM   Result Value Ref Range    Alcohol <10 MG/DL   CBC AND DIFF    Collection Time: 07/30/17  5:44 AM   Result Value Ref Range    White Blood Cells 12.4 (H) 4.5 - 11.0 K/UL    RBC 4.38 4.0 - 5.0 M/UL    Hemoglobin 10.8 (L) 12.0 - 15.0 GM/DL    Hematocrit 16.1 (L) 36 - 45 %    MCV 79.3 (L) 80 - 100 FL    MCH 24.5 (L) 26 - 34 PG    MCHC 30.9 (L) 32.0 - 36.0 G/DL    RDW 09.6 (H) 11 - 15 % Platelet Count 535 (H) 150 - 400 K/UL    MPV 8.1 7 - 11 FL    Neutrophils 56 41 - 77 %    Lymphocytes 32 24 -  44 %    Monocytes 10 4 - 12 %    Eosinophils 1 0 - 5 %    Basophils 1 0 - 2 %    Absolute Neutrophil Count 7.00 1.8 - 7.0 K/UL    Absolute Lymph Count 4.00 1.0 - 4.8 K/UL    Absolute Monocyte Count 1.20 (H) 0 - 0.80 K/UL    Absolute Eosinophil Count 0.10 0 - 0.45 K/UL    Absolute Basophil Count 0.10 0 - 0.20 K/UL   COMPREHENSIVE METABOLIC PANEL    Collection Time: 07/30/17  5:44 AM   Result Value Ref Range    Sodium 139 137 - 147 MMOL/L    Potassium 3.7 3.5 - 5.1 MMOL/L    Chloride 101 98 - 110 MMOL/L    Glucose 49 (LL) 70 - 100 MG/DL    Blood Urea Nitrogen 2 (L) 7 - 25 MG/DL    Creatinine 1.61 0.4 - 1.00 MG/DL    Calcium 8.5 8.5 - 09.6 MG/DL    Total Protein 5.5 (L) 6.0 - 8.0 G/DL    Total Bilirubin 0.3 0.3 - 1.2 MG/DL    Albumin 2.9 (L) 3.5 - 5.0 G/DL    Alk Phosphatase 045 (H) 25 - 110 U/L    AST (SGOT) 45 (H) 7 - 40 U/L    CO2 30 21 - 30 MMOL/L    ALT (SGPT) 59 (H) 7 - 56 U/L    Anion Gap 8 3 - 12    eGFR Non African American >60 >60 mL/min    eGFR African American >60 >60 mL/min   URINALYSIS DIPSTICK REFLEX TO CULTURE    Collection Time: 07/30/17  5:55 AM   Result Value Ref Range    Color,UA YELLOW     Turbidity,UA CLEAR CLEAR-CLEAR    Specific Gravity-Urine 1.010 1.003 - 1.035    pH,UA 8.0 5.0 - 8.0    Protein,UA NEG NEG-NEG    Glucose,UA NEG NEG-NEG    Ketones,UA TRACE (A) NEG-NEG    Bilirubin,UA NEG NEG-NEG    Blood,UA NEG NEG-NEG    Urobilinogen,UA NORMAL NORM-NORMAL    Nitrite,UA NEG NEG-NEG    Leukocytes,UA NEG NEG-NEG    Urine Ascorbic Acid, UA NEG NEG-NEG   URINALYSIS MICROSCOPIC REFLEX TO CULTURE    Collection Time: 07/30/17  5:55 AM   Result Value Ref Range    WBCs,UA 0-2 0 - 2 /HPF    RBCs,UA 0-2 0 - 3 /HPF    Comment,UA       Urine submitted for reflex culture if criteria are met:WBC>10, positive nitrite   and/or >=1+ leukocyte esterase. If quantity is not sufficient, an addendum will follow.      Squamous Epithelial Cells 0-2 0 - 5   COCAINE-URINE RANDOM    Collection Time: 07/30/17  5:55 AM   Result Value Ref Range    Cocaine-Urine NEG NEG-NEG   CANNABINOIDS-URINE RANDOM    Collection Time: 07/30/17  5:55 AM   Result Value Ref Range    THC NEG NEG-NEG   BENZODIAZEPINES-URINE RANDOM    Collection Time: 07/30/17  5:55 AM   Result Value Ref Range    Benzodiazepines NEG NEG-NEG   AMPHETAMINES-URINE RANDOM    Collection Time: 07/30/17  5:55 AM   Result Value Ref Range    Amphetamines NEG NEG-NEG   POC GLUCOSE    Collection Time: 07/30/17  7:10 AM   Result Value Ref Range    Glucose, POC 60 (L) 70 - 100 MG/DL  POC GLUCOSE    Collection Time: 07/30/17  7:28 AM   Result Value Ref Range    Glucose, POC 104 (H) 70 - 100 MG/DL   POC GLUCOSE    Collection Time: 07/30/17  8:47 AM   Result Value Ref Range    Glucose, POC 88 70 - 100 MG/DL     Glucose: (!) 49 (Critical Value GLU: Called To: ADAM C at: 07:04:59 by: KRO Read back by: ADAM   C  ) (07/30/17 0544)  POC Glucose (Download): 88 (07/30/17 0847)    Pertinent radiology reviewed.    Marty Heck, MD  Neurology Resident  Pager [phone]

## 2017-07-30 NOTE — Progress Notes
Pharmacy Vancomycin Note  Subjective:   Alyssa Cochran is a 36 y.o. female being treated for meningitis.    Objective:     Current Vancomycin Orders   Medication Dose Route Frequency    vancomycin  (VANCOCIN)  750 mg in D5W IVPB (premade)  15 mg/kg Intravenous Q12H*    And    vancomycin, pharmacy to manage  1 each Service Per Pharmacy     Start Date of  Vancomycin therapy: 07/29/2017  Additional Abx: CTX    White Blood Cells   Date/Time Value Ref Range Status   07/29/2017 0918 20.4 (H) 4.5 - 11.0 K/UL Final     Creatinine   Date/Time Value Ref Range Status   07/29/2017 0918 0.41 0.4 - 1.00 MG/DL Final     Blood Urea Nitrogen   Date/Time Value Ref Range Status   07/29/2017 0918 <2 (L) 7 - 25 MG/DL Final     Estimated CrCl: 129    Intake/Output Summary (Last 24 hours) at 07/29/17 1920  Last data filed at 07/29/17 1800   Gross per 24 hour   Intake                0 ml   Output                0 ml   Net                0 ml      Actual Weight:  43 kg (94 lb 11.2 oz)  Dosing BW:  43 kg     Assessment:   Target levels for this patient: trough 15-20.    Plan:   1. Start 750 mg IV Q12H  2. Next scheduled level(s): Prior to 4th dose  3. Pharmacy will continue to monitor and adjust therapy as needed.    Tillie Rung, [facility]  07/29/2017

## 2017-07-30 NOTE — Progress Notes
Pt A&Ox4. Pt ID band verified with patient.  Profile completed, Fall risk in place, care plan/education updated,  call light within reach

## 2017-07-30 NOTE — Progress Notes
Patient arrived to room # [phone]) via bed accompanied by transport. Patient transferred to the bed with assistance. Bedside safety checks completed. Initial patient assessment completed, refer to flowsheet for details. Admission skin assessment completed by: Joshuwa Vecchio, RN Fleet Contras, RN    Pressure Injury Present on Hospital Admission (within 24 hours): Yes    1. Occiput: No  2. Ear: No  3. Scapula: No  4. Spinous Process: No  5. Shoulder: No  6. Elbow: No  7. Iliac Crest: No  8. Sacrum/Coccyx: Yes  9. Ischial Tuberosity: No  10. Trochanter: No  11. Knee: No  12. Malleolus: No  13. Heel: No  14. Toes: No  15. Assessed for device associated injury Yes  16. Nursing Nutrition Assessment Completed Yes    See Doc Flowsheet for additional wound details.     INTERVENTIONS:

## 2017-07-30 NOTE — Procedures
INPATIENT EEG REPORT    Date of service: 07/30/2017  Name:    Alyssa Cochran  Date of birth:   1981/03/26  [name] MRN:   [id number]    PATIENT HISTORY:  This is a 36 y.o. female with a history of a possible seizure described as generalized tonic clonic with possible right arm focality.    TECHNICAL:  This EEG is performed in the awake and drowsy states. The EEG is performed on a 32 channel digital recording device. The 10-20 international system of electrode placement is used with additional FT9 and FT10 electrodes. Hyperventilation is not performed. Photic stimulation is performed.    REPORT:    BACKGROUND:  The posterior dominant rhythm is 8-9 Hz.  The heart rate is 76 bpm.    SLEEP:  As the patient enters drowsiness there is an attenuation of background rhythms. Stage II sleep is not seen.    ABNORMALITIES:  1. There is intermittent generalized theta slowing.  This slowing is noted as 2-3 second bursts of slowing and was present in 30-40% of the recording.    IMPRESSION  This abnormal EEG is indicative of a mild encephalopathy.  No epileptiform activity or lateralizing signs seen.    Elsie Lincoln, M.D.  Neurology PGY-3    Attestation    I personally reviewed this study and the fellow's report and formulated the above mentioned opinions and interpretations in this study.    Ardelle Lesches, M.D.

## 2017-07-31 ENCOUNTER — Inpatient Hospital Stay: Admit: 2017-07-29 | Discharge: 2017-07-31 | Discharge status: Against Medical Advice | Payer: BC Managed Care – HMO

## 2017-07-31 ENCOUNTER — Encounter: Admit: 2017-07-31 | Discharge: 2017-07-31 | Payer: BC Managed Care – HMO | Primary: Adult Health

## 2017-07-31 ENCOUNTER — Emergency Department: Admit: 2017-07-29 | Discharge: 2017-07-29 | Payer: BC Managed Care – HMO

## 2017-07-31 DIAGNOSIS — F411 Generalized anxiety disorder: ICD-10-CM

## 2017-07-31 DIAGNOSIS — K760 Fatty (change of) liver, not elsewhere classified: ICD-10-CM

## 2017-07-31 DIAGNOSIS — Z7952 Long term (current) use of systemic steroids: ICD-10-CM

## 2017-07-31 DIAGNOSIS — Z9049 Acquired absence of other specified parts of digestive tract: ICD-10-CM

## 2017-07-31 DIAGNOSIS — E860 Dehydration: ICD-10-CM

## 2017-07-31 DIAGNOSIS — R64 Cachexia: ICD-10-CM

## 2017-07-31 DIAGNOSIS — G4700 Insomnia, unspecified: ICD-10-CM

## 2017-07-31 DIAGNOSIS — G9340 Encephalopathy, unspecified: ICD-10-CM

## 2017-07-31 DIAGNOSIS — Z681 Body mass index (BMI) 19 or less, adult: ICD-10-CM

## 2017-07-31 DIAGNOSIS — Z820 Family history of epilepsy and other diseases of the nervous system: ICD-10-CM

## 2017-07-31 DIAGNOSIS — F329 Major depressive disorder, single episode, unspecified: ICD-10-CM

## 2017-07-31 DIAGNOSIS — R569 Unspecified convulsions: Principal | ICD-10-CM

## 2017-07-31 DIAGNOSIS — S0990XA Unspecified injury of head, initial encounter: ICD-10-CM

## 2017-07-31 DIAGNOSIS — K5090 Crohn's disease, unspecified, without complications: ICD-10-CM

## 2017-07-31 DIAGNOSIS — F17200 Nicotine dependence, unspecified, uncomplicated: ICD-10-CM

## 2017-07-31 LAB — FOLATE, SERUM: Lab: 10 ng/mL — ABNORMAL LOW (ref >3.9)

## 2017-07-31 LAB — IRON + BINDING CAPACITY + %SAT+ FERRITIN: Lab: 23 ug/dL — ABNORMAL LOW (ref 50–160)

## 2017-07-31 LAB — PROTIME INR (PT): Lab: 1.1 (ref 0.8–1.2)

## 2017-07-31 LAB — VITAMIN B12: Lab: 416 pg/mL — ABNORMAL HIGH (ref 180–914)

## 2017-07-31 NOTE — Discharge Instructions - Pharmacy
Physician Discharge Summary      Name: Alyssa Cochran  Medical Record Number: [id number]        Account Number:  [id number]  Date Of Birth:  26-Mar-1981                         Age:  36 years   Admit date:  07/29/2017                     Discharge date:  07/30/17    Attending Physician:  Dr. Alfredia Client           Service: Med 1- 2042    Physician Summary completed by: Richarda Osmond, MD    Reason for hospitalization: Acute onset seizure    Significant PMH:   Past Medical History:   Diagnosis Date   ??? Back pain    ??? Heart disease    ??? Stomach pain        Allergies: Patient has no known allergies.    Admission Physical Exam notable for:    Vital Signs: Last Filed In 24 Hours Vital Signs: 24 Hour Range   BP: 105/70 (11/08 1400)  Temp: 37.1 ???C (98.7 ???F) (11/08 7829)  Pulse: 83 (11/08 1400)  Respirations: 12 PER MINUTE (11/08 1400)  SpO2: 95 % (11/08 1400)  O2 Delivery: None (Room Air) (11/08 0856)  SpO2 Pulse: 84 (11/08 1400)  Height: 160 cm (63) (11/08 0856) BP: (105-135)/(70-102)   Temp:  [36.7 ???C (98 ???F)-37.1 ???C (98.7 ???F)]   Pulse:  [78-91]   Respirations:  [8 PER MINUTE-14 PER MINUTE]   SpO2:  [94 %-99 %]   O2 Delivery: None (Room Air)   Intensity Pain Scale (Self Report): 7 (07/29/17 0915) ???   ???  General:  Alert, cooperative, mild distress, appears stated age  Head: Right-sided scalp abrasion tender to the touch.  Minimal serosanguineous exudate exudate.  Throat: Lips, mucosa and tongue normal.  Teeth and gums normal  Neck:    Supple, symmetrical, trachea midline  Back:  Symmetric, no curvature, ROM normal.  Lungs: Diffuse inspiratory and expiratory wheezes heard bilaterally.  Chest wall:  No tenderness or deformity.  Heart:   Regular rate and rhythm  Abdomen:  Soft.  Diffusely tender to palpation throughout abdomen. Bowel sounds normal.  No masses.  No organomegaly.  Extremities: Extremities normal, atraumatic, no cyanosis or edema  Skin: Skin color, texture, turgor normal.  No rashes or lesions Neurologic: CNII - XII intact.  Normal strength, sensation and reflexes throughout.    Admission Lab/Radiology studies notable for:   Results for orders placed or performed during the hospital encounter of 07/29/17 (from the past 24 hour(s))   CBC AND DIFF   ??? Collection Time: 07/29/17  9:18 AM   Result Value Ref Range   ??? White Blood Cells 20.4 (H) 4.5 - 11.0 K/UL   ??? RBC 4.54 4.0 - 5.0 M/UL   ??? Hemoglobin 11.0 (L) 12.0 - 15.0 GM/DL   ??? Hematocrit 36.1 36 - 45 %   ??? MCV 79.5 (L) 80 - 100 FL   ??? MCH 24.3 (L) 26 - 34 PG   ??? MCHC 30.6 (L) 32.0 - 36.0 G/DL   ??? RDW 19.7 (H) 11 - 15 %   ??? Platelet Count 457 (H) 150 - 400 K/UL   ??? MPV 8.4 7 - 11 FL   ??? Neutrophils 76 41 - 77 %   ???  Lymphocytes 18 (L) 24 - 44 %   ??? Monocytes 6 4 - 12 %   ??? Eosinophils 0 0 - 5 %   ??? Basophils 0 0 - 2 %   ??? Absolute Neutrophil Count 15.30 (H) 1.8 - 7.0 K/UL   ??? Absolute Lymph Count 3.70 1.0 - 4.8 K/UL   ??? Absolute Monocyte Count 1.20 (H) 0 - 0.80 K/UL   ??? Absolute Eosinophil Count 0.10 0 - 0.45 K/UL   ??? Absolute Basophil Count 0.10 0 - 0.20 K/UL   COMPREHENSIVE METABOLIC PANEL   ??? Collection Time: 07/29/17  9:18 AM   Result Value Ref Range   ??? Sodium 134 (L) 137 - 147 MMOL/L   ??? Potassium 3.9 3.5 - 5.1 MMOL/L   ??? Chloride 96 (L) 98 - 110 MMOL/L   ??? Glucose 61 (L) 70 - 100 MG/DL   ??? Blood Urea Nitrogen <2 (L) 7 - 25 MG/DL   ??? Creatinine 0.41 0.4 - 1.00 MG/DL   ??? Calcium 8.7 8.5 - 10.6 MG/DL   ??? Total Protein 6.3 6.0 - 8.0 G/DL   ??? Total Bilirubin 0.2 (L) 0.3 - 1.2 MG/DL   ??? Albumin 3.4 (L) 3.5 - 5.0 G/DL   ??? Alk Phosphatase 221 (H) 25 - 110 U/L   ??? AST (SGOT) 127 (H) 7 - 40 U/L   ??? CO2 29 21 - 30 MMOL/L   ??? ALT (SGPT) 89 (H) 7 - 56 U/L   ??? Anion Gap 9 3 - 12   ??? eGFR Non African American >60 >60 mL/min   ??? eGFR African American >60 >60 mL/min   MAGNESIUM   ??? Collection Time: 07/29/17  9:18 AM   Result Value Ref Range   ??? Magnesium 1.9 1.6 - 2.6 mg/dL   POC GLUCOSE   ??? Collection Time: 07/29/17  9:20 AM   Result Value Ref Range ??? Glucose, POC 67 (L) 70 - 100 MG/DL   URINALYSIS DIPSTICK   ??? Collection Time: 07/29/17  9:38 AM   Result Value Ref Range   ??? Color,UA YELLOW ???   ??? Turbidity,UA 1+ (A) CLEAR-CLEAR   ??? Specific Gravity-Urine 1.013 1.003 - 1.035   ??? pH,UA 6.0 5.0 - 8.0   ??? Protein,UA NEG NEG-NEG   ??? Glucose,UA NEG NEG-NEG   ??? Ketones,UA NEG NEG-NEG   ??? Bilirubin,UA NEG NEG-NEG   ??? Blood,UA NEG NEG-NEG   ??? Urobilinogen,UA NORMAL NORM-NORMAL   ??? Nitrite,UA NEG NEG-NEG   ??? Leukocytes,UA NEG NEG-NEG   ??? Urine Ascorbic Acid, UA NEG NEG-NEG   URINALYSIS, MICROSCOPIC   ??? Collection Time: 07/29/17  9:38 AM   Result Value Ref Range   ??? WBCs,UA 0-2 0 - 2 /HPF   ??? RBCs,UA 0-2 0 - 3 /HPF   ??? MucousUA TRACE ???   ??? Bacteria,UA FEW (A) NEG-NEG   ??? Squamous Epithelial Cells 10-20 0 - 5   ??? Hyaline Cast 5-10 ???   ???  Glucose: (!) 61 (07/29/17 0918)  POC Glucose (Download): (!) 67 (07/29/17 0920)  Urine Pregnancy: (S) Negative (07/29/17 0943)    Brief Hospital Course: Alyssa Cochran is a 36 year old female who was admitted to the hospital for a unprovoked first-time seizure.  Neurology was consulted and performed an EEG that showed mild encephalopathy.  A lumbar puncture was done that showed a mixed picture with a slightly elevated white blood cell count that was neutrophil predominant, however also showed low glucose and normal protein.  She was started on broad-spectrum vancomycin and ceftriaxone, ID was consulted.  She also has a questionable history of Crohn's disease.  GI was consulted for a workup as well.  However, her infectious workup and workup for Crohn's disease was unable to be completed because the patient decided to leave AGAINST MEDICAL ADVICE.  She was educated on the risks and benefits but did not want to stay in the hospital and understood the risk she was predisposing herself to by not receiving medical treatment for the suspected bacterial infection.  With the uncertainty of the cause of her seizure she was not sent with any antibiotics.  She was given strict return precautions and discharged AGAINST MEDICAL ADVICE.    Condition at Discharge: AMA    Discharge Diagnoses:      Hospital Problems        Active Problems    Seizure [facility]        Surgical Procedures: None    Significant Diagnostic Studies and Procedures: noted in brief hospital course    Consults:  ID and Neurology    Patient Disposition: Home       Patient instructions/medications:     Activity as Tolerated   It is important to keep increasing your activity level after you leave the hospital.  Moving around can help prevent blood clots, lung infection (pneumonia) and other problems.  Gradually increasing the number of times you are up moving around will help you return to your normal activity level more quickly.  Continue to increase the number of times you are up to the chair and walking daily to return to your normal activity level. Begin to work toward your normal activity level at discharge     Report These Signs and Symptoms   Please contact your doctor if you have any of the following symptoms: temperature higher than 100 degrees F, uncontrolled pain, persistent nausea and/or vomiting, difficulty breathing, chest pain, severe abdominal pain, headache, unable to urinate, unable to have bowel movement or drainage with a foul odor     Questions About Your Stay   For questions or concerns regarding your hospital stay. Call [phone]   Discharging attending physician: Alfredia Client [[id number]]      Regular Diet   You have no dietary restriction. Please continue with a healthy balanced diet.        Current Discharge Medication List       CONTINUE these medications which have been CHANGED or REFILLED    Details   !! prednisone (DELTASONE) 10 mg tablet Take three tablets by mouth daily with breakfast.  Qty: 14 tablet, Refills: 0    PRESCRIPTION TYPE:  Normal      !! prednisone (DELTASONE) 10 mg tablet Take two tablets by mouth daily with breakfast for 14 days. Qty: 14 tablet, Refills: 0    PRESCRIPTION TYPE:  Normal      !! prednisone (DELTASONE) 10 mg tablet Take one tablet by mouth daily with breakfast for 14 days.  Qty: 14 tablet, Refills: 0    PRESCRIPTION TYPE:  Normal      !! prednisone (DELTASONE) 10 mg tablet Take one-half tablet by mouth daily with breakfast for 14 days.  Qty: 7 tablet, Refills: 0    PRESCRIPTION TYPE:  Normal       !! - Potential duplicate medications found. Please discuss with provider.       CONTINUE these medications which have NOT CHANGED    Details   albuterol 0.083% (PROVENTIL; VENTOLIN)  2.5 mg /3 mL (0.083 %) nebulizer solution Inhale 3 mL solution by nebulizer as directed every 6 hours as needed for Wheezing or Shortness of Breath.    PRESCRIPTION TYPE:  Historical Med      brexpiprazole 1 mg tab Take 1 tablet by mouth daily.    PRESCRIPTION TYPE:  Historical Med      citalopram (CELEXA) 40 mg tablet Take 40 mg by mouth daily.    PRESCRIPTION TYPE:  Historical Med      fluticasone (FLONASE) 50 mcg/actuation nasal spray Apply 1 spray to each nostril as directed daily. Shake bottle gently before using.    PRESCRIPTION TYPE:  Historical Med      HYDROcodone/acetaminophen (NORCO) 5/325 mg tablet Take 1 tablet by mouth every 4 hours as needed for Pain  Qty: 20 tablet, Refills: 0    PRESCRIPTION TYPE:  Print  Associated Diagnoses: Crohn's colitis, with intestinal obstruction (HCC)      ondansetron (ZOFRAN ODT) 4 mg rapid dissolve tablet Dissolve 4 mg by mouth every 8 hours as needed for Nausea or Vomiting. Place on tongue to disolve.    PRESCRIPTION TYPE:  Historical Med      pantoprazole DR (PROTONIX) 40 mg tablet Take 40 mg by mouth daily.    PRESCRIPTION TYPE:  Historical Med      traMADol (ULTRAM) 50 mg tablet Take 1 tablet by mouth every 6 hours as needed for Pain. Indications: PAIN  Qty: 20 tablet, Refills: 0    PRESCRIPTION TYPE:  Print  Associated Diagnoses: Crohn's colitis, with intestinal obstruction (HCC) zolpidem (AMBIEN) 10 mg tablet Take 10 mg by mouth at bedtime as needed for Sleep.    PRESCRIPTION TYPE:  Historical Med          The following medications were removed from your list. This list includes medications discontinued this stay and those removed from your prior med list in our system        electrolyte GUT PEG (NULYTELY, COLYTE, GAVILYTE-N) 420 gram oral solution        other medication        POTASSIUM (POTASSIMIN PO)        potassium chloride(+) (MICRO-K) 10 mEq capsule                 Pending items needing follow up: Patient left AMA and will need to follow up the work-up of her Crohn's and suspected meningitis with her primary care provider.    Signed:  Richarda Osmond, MD  07/31/2017      cc:  Primary Care Physician:  Ellyn Hack   PCP Unknown  Referring physicians:  No ref. provider found   Additional provider(s):

## 2017-07-31 NOTE — Progress Notes
1800 Patient reports "when my husband gets here I am going to sign myself out AMA". Per Lyndel Safe, MD patient has capacity to leave AMA, but he will come talk to the patient.

## 2017-07-31 NOTE — Progress Notes
Alyssa Cochran left hospital AMA on 07/30/2017. MD at bedside. RN provided discharge instructions. .  Discharge instructions reviewed with patient and family.  Valuables returned:   Personal Items / Valuables: [employer] streamer, Cell Phone.  Home medications: N/A   .  Functional assessment at discharge complete: Yes .

## 2017-07-31 NOTE — Progress Notes
I had a lengthy discussion with the Patient and her husband. They were made aware that we are treating a possible meningitis which means infection of your brain and if we don't treat it with IV antibiotics it may cause deleterious effects like seizures, confusion, encephalopathy, permanent incapacitation and even death.     Patient understood that what she has and what are doing for her treatment and she understood if she leaves hospital she can die.   Husband wanted her to stay but she has capacity and decided to leave Against medical advise.     Dr. Edd Fabian and Dr. Noah Charon were informed about this. She was advised to return to Hospital if her symptoms worsened. She is not discharged on any Antibiotics.

## 2017-08-01 ENCOUNTER — Encounter: Admit: 2017-08-01 | Discharge: 2017-08-01 | Payer: BC Managed Care – HMO | Primary: Adult Health

## 2017-08-01 LAB — CMV QUANT PCR-FLUID

## 2017-08-01 LAB — CULTURE-CSF W/SENSITIVITY

## 2017-08-02 LAB — VITAMIN A: Lab: 18 ng/mL — ABNORMAL LOW (ref 10–200)

## 2017-08-02 LAB — ZINC: Lab: 1.3 — ABNORMAL HIGH

## 2017-08-02 LAB — SELENIUM: Lab: 83

## 2017-08-03 LAB — VITAMIN B1 (THIAMINE) WHOLE BLD: Lab: 110

## 2017-08-03 LAB — PYRIDOXAL 5 PHOSPHATE: Lab: 3 — ABNORMAL LOW

## 2017-08-04 LAB — CULTURE-BLOOD W/SENSITIVITY

## 2017-08-12 ENCOUNTER — Inpatient Hospital Stay
Admit: 2017-08-12 | Discharge: 2017-08-16 | Disposition: A | Discharge status: Home / Self Care | Payer: BC Managed Care – HMO

## 2017-08-12 ENCOUNTER — Encounter: Admit: 2017-08-12 | Discharge: 2017-08-12 | Payer: BC Managed Care – HMO | Primary: Adult Health

## 2017-08-12 ENCOUNTER — Emergency Department: Admit: 2017-08-12 | Discharge: 2017-08-12 | Payer: BC Managed Care – HMO

## 2017-08-12 DIAGNOSIS — M549 Dorsalgia, unspecified: ICD-10-CM

## 2017-08-12 DIAGNOSIS — I519 Heart disease, unspecified: Principal | ICD-10-CM

## 2017-08-12 DIAGNOSIS — R109 Unspecified abdominal pain: ICD-10-CM

## 2017-08-12 DIAGNOSIS — R569 Unspecified convulsions: ICD-10-CM

## 2017-08-12 LAB — URINALYSIS MICROSCOPIC REFLEX TO CULTURE

## 2017-08-12 LAB — COMPREHENSIVE METABOLIC PANEL
Lab: 0.2 mg/dL — ABNORMAL LOW (ref 0.3–1.2)
Lab: 0.4 mg/dL — ABNORMAL LOW (ref 0.4–1.00)
Lab: 105 MMOL/L (ref 98–110)
Lab: 141 MMOL/L (ref 137–147)
Lab: 15 U/L (ref 7–40)
Lab: 189 U/L — ABNORMAL HIGH (ref 25–110)
Lab: 20 U/L (ref 7–56)
Lab: 3.4 g/dL — ABNORMAL LOW (ref 3.5–5.0)
Lab: 5 mg/dL — ABNORMAL LOW (ref 7–25)
Lab: 6.4 g/dL — ABNORMAL HIGH (ref 6.0–8.0)
Lab: 7 10*3/uL (ref 3–12)
Lab: 9 mg/dL — ABNORMAL HIGH (ref 8.5–10.6)
Lab: 94 mg/dL — ABNORMAL LOW (ref 70–100)
Lab: >60 mL/min — ABNORMAL HIGH (ref >60)

## 2017-08-12 LAB — URINALYSIS DIPSTICK REFLEX TO CULTURE
Lab: NEGATIVE
Lab: NEGATIVE
Lab: NEGATIVE

## 2017-08-12 LAB — GRAM STAIN

## 2017-08-12 LAB — LIPASE: Lab: 47 U/L — ABNORMAL LOW (ref 11–82)

## 2017-08-12 LAB — SED RATE: Lab: 62 mm/h — ABNORMAL HIGH (ref 0–20)

## 2017-08-12 LAB — C REACTIVE PROTEIN (CRP)

## 2017-08-12 LAB — LACTIC ACID(LACTATE): Lab: 2.9 MMOL/L — ABNORMAL HIGH (ref 0.5–2.0)

## 2017-08-12 LAB — CBC AND DIFF
Lab: 0.1 10*3/uL (ref 0–0.20)
Lab: 0.1 10*3/uL (ref 0–0.45)
Lab: 14 10*3/uL — ABNORMAL HIGH (ref 4.5–11.0)

## 2017-08-12 LAB — TOTAL PROTEIN-CSF: Lab: 25 mg/dL (ref 15–45)

## 2017-08-12 LAB — GLUCOSE-CSF: Lab: 59 mg/dL (ref 40–75)

## 2017-08-12 MED ORDER — POTASSIUM CHLORIDE 20 MEQ PO TBTQ
40 meq | Freq: Once | ORAL | 0 refills | Status: DC
Start: 2017-08-12 — End: 2017-08-12

## 2017-08-12 MED ORDER — ZOLPIDEM 10 MG PO TAB
10 mg | Freq: Every evening | ORAL | 0 refills | Status: CN | PRN
Start: 2017-08-12 — End: ?

## 2017-08-12 MED ORDER — PANTOPRAZOLE 40 MG PO TBEC
40 mg | Freq: Every day | ORAL | 0 refills | Status: DC
Start: 2017-08-12 — End: 2017-08-17
  Administered 2017-08-13 – 2017-08-16 (×5): 40 mg via ORAL

## 2017-08-12 MED ORDER — LEVETIRACETAM 500 MG PO TAB
500 mg | Freq: Once | ORAL | 0 refills | Status: CP
Start: 2017-08-12 — End: ?
  Administered 2017-08-12: 21:00:00 500 mg via ORAL

## 2017-08-12 MED ORDER — LACTATED RINGERS IV SOLP
1000 mL | INTRAVENOUS | 0 refills | Status: CP
Start: 2017-08-12 — End: ?
  Administered 2017-08-12: 20:00:00 1000 mL via INTRAVENOUS

## 2017-08-12 MED ORDER — ENOXAPARIN 40 MG/0.4 ML SC SYRG
40 mg | Freq: Every day | SUBCUTANEOUS | 0 refills | Status: DC
Start: 2017-08-12 — End: 2017-08-17
  Administered 2017-08-14 – 2017-08-15 (×2): 40 mg via SUBCUTANEOUS

## 2017-08-12 MED ORDER — IV PB BUILDER
Freq: Once | INTRAVENOUS | 0 refills | Status: CP
Start: 2017-08-12 — End: ?
  Administered 2017-08-12 (×2): 101.000 mL via INTRAVENOUS

## 2017-08-12 MED ORDER — PREDNISONE 20 MG PO TAB
40 mg | Freq: Every day | ORAL | 0 refills | Status: DC
Start: 2017-08-12 — End: 2017-08-14
  Administered 2017-08-13 – 2017-08-14 (×3): 40 mg via ORAL

## 2017-08-12 MED ORDER — OXYCODONE 5 MG PO TAB
5-10 mg | ORAL | 0 refills | Status: DC | PRN
Start: 2017-08-12 — End: 2017-08-14
  Administered 2017-08-13 (×5): 10 mg via ORAL

## 2017-08-12 MED ORDER — CHLORPROMAZINE IVPB
25 mg | Freq: Once | INTRAVENOUS | 0 refills | Status: CP
Start: 2017-08-12 — End: ?
  Administered 2017-08-12 (×2): 25 mg via INTRAVENOUS

## 2017-08-12 MED ORDER — ONDANSETRON HCL 4 MG PO TAB
4 mg | ORAL | 0 refills | Status: DC | PRN
Start: 2017-08-12 — End: 2017-08-13
  Administered 2017-08-13: 02:00:00 4 mg via ORAL

## 2017-08-12 MED ORDER — LACTATED RINGERS IV SOLP
1000 mL | Freq: Once | INTRAVENOUS | 0 refills | Status: CP
Start: 2017-08-12 — End: ?
  Administered 2017-08-13: 1000 mL via INTRAVENOUS

## 2017-08-12 MED ORDER — ONDANSETRON HCL (PF) 4 MG/2 ML IJ SOLN
4 mg | INTRAVENOUS | 0 refills | Status: DC | PRN
Start: 2017-08-12 — End: 2017-08-17
  Administered 2017-08-13 – 2017-08-14 (×3): 4 mg via INTRAVENOUS

## 2017-08-12 MED ORDER — CITALOPRAM 40 MG PO TAB
40 mg | Freq: Every day | ORAL | 0 refills | Status: DC
Start: 2017-08-12 — End: 2017-08-17
  Administered 2017-08-13 – 2017-08-16 (×4): 40 mg via ORAL

## 2017-08-12 MED ORDER — GADOBENATE DIMEGLUMINE 529 MG/ML (0.1MMOL/0.2ML) IV SOLN
8 mL | Freq: Once | INTRAVENOUS | 0 refills | Status: CP
Start: 2017-08-12 — End: ?
  Administered 2017-08-12: 22:00:00 8 mL via INTRAVENOUS

## 2017-08-12 MED ORDER — SODIUM CHLORIDE 0.9 % IJ SOLN
50 mL | Freq: Once | INTRAVENOUS | 0 refills | Status: CP
Start: 2017-08-12 — End: ?
  Administered 2017-08-13: 01:00:00 50 mL via INTRAVENOUS

## 2017-08-12 MED ORDER — LORAZEPAM 2 MG/ML IJ SOLN
1 mg | Freq: Once | INTRAVENOUS | 0 refills | Status: CP
Start: 2017-08-12 — End: ?
  Administered 2017-08-12: 22:00:00 1 mg via INTRAVENOUS

## 2017-08-12 MED ORDER — POTASSIUM CHLORIDE IN WATER 10 MEQ/50 ML IV PGBK
10 meq | INTRAVENOUS | 0 refills | Status: CP
Start: 2017-08-12 — End: ?
  Administered 2017-08-13 (×4): 10 meq via INTRAVENOUS

## 2017-08-12 MED ORDER — POTASSIUM CHLORIDE IN WATER 10 MEQ/50 ML IV PGBK
10 meq | INTRAVENOUS | 0 refills | Status: DC
Start: 2017-08-12 — End: 2017-08-12

## 2017-08-12 MED ORDER — IOPAMIDOL 76 % IV SOLN
80 mL | Freq: Once | INTRAVENOUS | 0 refills | Status: CP
Start: 2017-08-12 — End: ?
  Administered 2017-08-13: 01:00:00 80 mL via INTRAVENOUS

## 2017-08-12 MED ORDER — TRAZODONE 50 MG PO TAB
50 mg | Freq: Once | ORAL | 0 refills | Status: CP
Start: 2017-08-12 — End: ?
  Administered 2017-08-13: 05:00:00 50 mg via ORAL

## 2017-08-12 MED ORDER — POTASSIUM CHLORIDE 20 MEQ PO TBTQ
40 meq | Freq: Once | ORAL | 0 refills | Status: CP
Start: 2017-08-12 — End: ?
  Administered 2017-08-12: 20:00:00 40 meq via ORAL

## 2017-08-12 MED ORDER — TRAMADOL 50 MG PO TAB
50 mg | ORAL | 0 refills | Status: DC | PRN
Start: 2017-08-12 — End: 2017-08-13
  Administered 2017-08-13 (×2): 50 mg via ORAL

## 2017-08-12 MED ORDER — IV PB BUILDER
Freq: Once | INTRAVENOUS | 0 refills | Status: DC
Start: 2017-08-12 — End: 2017-08-12

## 2017-08-12 MED ORDER — FENTANYL CITRATE (PF) 50 MCG/ML IJ SOLN
50 ug | Freq: Once | INTRAVENOUS | 0 refills | Status: DC
Start: 2017-08-12 — End: 2017-08-12

## 2017-08-12 NOTE — ED Notes
When patient leaves room to provide urine specimen, husband reports that the patient had been prescribed Tramadol by pain management who then refused to prescribe. Her PCP then began to prescribe Tramadol. 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 Husband reports that the patient recently ran out of Tramadol and worries that the patient may be in withdrawal.

## 2017-08-12 NOTE — Progress Notes
Patient arrived to room # [id number] via cart accompanied by transporter. Patient transferred to the bed  with assistance. Bedside safety checks completed. Initial patient assessment completed, refer to flowsheet for details. Admission skin assessment completed by: Sharlynn Oliphant, RN      Pressure Injury Present on Hospital Admission (within 24 hours): Y    1. Occiput: N  2. Ear:N  3. Scapula:N  4. Spinous Process:N5. Shoulder: N   6. Elbow:N  7. Iliac Crest:N  8. Sacrum/Coccyx:Old Scarring  9. Ischial Tuberosity:N  10. Trochanter:N  11. Knee:N   12. Malleolus:N  13. Heel:N  14. Toes: N  15. Assessed for device associated injury: N  16. Nursing Nutrition Assessment Completed N  See Doc Flowsheet for additional wound details.     INTERVENTIONS:

## 2017-08-12 NOTE — ED Notes
CSF fluid walked to lab by Will, tech.

## 2017-08-12 NOTE — H&P (View-Only)
Admission History and Physical Examination      Name:  Alyssa Cochran                                             MRN:  [id number]   Admission Date:  08/12/2017                     Assessment/Plan:    Active Problems:    Abdominal pain    Seizure [facility])    36 y.o. female with PMH of Crohn's Disease, depression, anxiety, and recent hospitalization from 11/8-11/9 for possible seizure (left AMA) who presents with 2 more episodes of reported seizures, headache, abdominal pain, and nausea/vomiting.    ## Seizure  ## Right Sided Headache  - Patient initially hospitalized for reported seizure from 11/8-11/9 - left AMA from this hospitalization before full work-up could be completed  - Patient reports additional seizure episode on 11/11 and last on 08/11/2017  - Last seizure witnessed by son - describing tonic-clonic movements. One seizure associated with bladder incontinence.  - S/p EEG 07/30/2017: intermittent generalized theta slowing indicative of a mild encephalopathy.  No epileptiform activity or lateralizing signs seen.  - CT Head 07/29/2017: no acute intracranial hemorrhage or calvarial fracture  - S/p LP in ED 08/12/2017: WBC 2, RBC 1, glucose 59, protein 25 - appear to be normal findings   - WBC slightly elevated at 14K but this could be from nausea/vomiting or steroid use  PLAN  > Neurology consulted in ED: planning for MRI Brain w/wo  > Seizure Precautions  > Will hold off on Abx for possible meningitis as CSF findings not consistent with bacterial meningitis. F/U CSF culture    ## Abdominal Pain  ## Hx of Crohn's Disease  ## Nausea/Vomiting  ## Diarrhea with Intermittent Hematochezia  - DDx for abdominal pain: Crohn's flare vs. gastroenteritis  - Originally seen by Dr. Phil Dopp at [facility] gastroenterology in [place]  - EGD and colonoscopy 2015- EGD without any abnormality. Colonoscopy showed scattered areas of mild erythematous mucosa the left side of the colon, small bowel biopsy negative for celiac disease, rectosigmoid biopsies consistent with focal active colitis and other colonic biopsies consistent with melanosis coli    - Flex sigmoidoscopy 07/2015 by Dr. Phil Dopp - erythematous mucosa along the rectosigmoid with freability and biopsy consistent with chronic inflammation   - H/o of small bowel resection (10 inches) on 12/2015 at [facility], [place] for intestinal obstruction due to intussception. Pathology showed findings consistent with crohn's disease as per pt   - CT Abd/Pelv 118/2018: Apparent wall thickening and enhancement of the terminal ileum as well as mild enhancement of the ascending colon, which may represent inflammatory enterocolitis given the history of Crohn's disease.  - Has been on 40 mg Prednisone qday for over a year    PLAN  > Will continue prednisone 40 mg Qday  > GI consult  > ESR/CRP. Repeat CT Abd/Pelvis.  > IVF with LR at 100 ml/hr for 1 L  > Stool Culture, fecal leukocytes, and c. Diff testing  > PRN anti-emetics  > PRN tramadol and oxycodone for abdominal pain    ## Lactic Acidosis   - LA 2.9 on admit - likely from nausea/vomiting with poor oral intake  PLAN  > IVF as above  > Recheck lactic acid    ## Hypokalemia  -  Providing oral and IV replacement    ## Depression  - Continue PTA citalopram    #Severe Malnutrition  - BMI 18.2  -Consult dietary and placed calorie count        Fluids, electrolytes and Nutrition:  IVF: LR at 100 ml/hr for 1 L  Electrolytes: replacing prn  Diet: Diet Regular    Prophylaxis:  DVT: Lovenox    Code status: Full Code  Disposition: Admit to Med 1.    Patient discussed with on-call attending, Dr. Betti Cruz.    Verita Lamb, M.D.  Internal Medicine Resident - PGY3  Pager [phone]  ________________________________________________________________________  Primary Care Physician: Ellyn Hack  Verified    Chief Complaint: I have had 2 more seizures since the last time I was here.    Subjective:  Alyssa Cochran is a 36 y.o. female with PMH of Crohn's Disease, depression, anxiety, and recent hospitalization from 11/8-11/9 for possible seizure (left AMA) who presents with 2 more episodes of reported seizures, headache, abdominal pain, and nausea/vomiting. She states that since her discharge on 11/9, she has had 2 further seizures. She states the first happened on 11/11 when she was at home and dropped to floor. She states she was shaking all over and thinks she was able remember all of the event. This lasted about a couple of minutes and was associated with some bladder incontinence but no tongue biting or stool incontinence. She then had another seizure at ~1530 on 08/11/2017. This episode was witness by her son. She reportedly was lying on the couch when suddenly her arms raised up and started to shake all over. This episode lasted ~ 3 minutes, and no fall was reported. She also denies any bowel or bladder incontinence with this episode. She states she slept after this episode.    These seizure-like episodes have been associated with persistent right-sided headache that has been present ever since 1st episode on 11/8. She also reports some right-sided neck pain and stiffness. She reports some photophobia but no phonophobia. She denies any radiation of the pain. In addition, she reports worsening abdominal pain for past 2-3 weeks. Pain is mainly located in RUQ but states pain is present all over abdomen. She also has had non-bloody, non-bilious vomiting with any oral intake for past 1 week. She reports some diarrhea with intermittent bright red blood seen. Last episode of bright red blood in stool was this morning but she states it was mainly on toilet paper. She denies any dark or tarry stools. She also denies having any fevers, chills, chest pain, or SOB.     Review of Systems:  System  Positives  Negatives    Constitutional:  Unintentional weight loss Fever, chills   Eyes:   Diplopia, blurry vision   ENT:   Congestion Respiratory:   Shortness of breath, cough    Cardiovascular:   Chest pain, palpitations, claudication    Gastrointestinal:  Nausea, vomiting, abdominal pain, diarrhea, hematochezia Hematemesis, melena   Genitourinary:   Dysuria    Hematologic:   Bleeding dyscrasias, easy bruising    Musculoskeletal:   Arthralgias, myalgias    Neurological:  Headache, neck pain and stiffness Numbness, tingling, loss of consciousness, seizures, tremors   Behavioral/Psych:   Suicidal ideation, hallucinations, delusions, mood swings   Skin:  Rash, itching       Past Medical History:   Diagnosis Date   ??? Back pain    ??? Heart disease    ??? Stomach pain  Past Surgical History:   Procedure Laterality Date   ??? COLON SURGERY     ??? TUBAL LIGATION       Family History   Problem Relation Age of Onset   ??? COPD Mother    ??? Arthritis Mother    ??? Heart Disease Father      Social History     Social History   ??? Marital status: Married     Spouse name: N/A   ??? Number of children: N/A   ??? Years of education: N/A     Social History Main Topics   ??? Smoking status: Current Every Day Smoker     Packs/day: 1.00     Years: 15.00   ??? Smokeless tobacco: Never Used   ??? Alcohol use No   ??? Drug use: No   ??? Sexual activity: Not on file     Other Topics Concern   ??? Not on file     Social History Narrative   ??? No narrative on file       Allergies:  Patient has no known allergies.    Medications:  Prescriptions Prior to Admission   Medication Sig   ??? albuterol 0.083% (PROVENTIL; VENTOLIN) 2.5 mg /3 mL (0.083 %) nebulizer solution Inhale 3 mL solution by nebulizer as directed every 6 hours as needed for Wheezing or Shortness of Breath.   ??? brexpiprazole 1 mg tab Take 1 tablet by mouth daily.   ??? citalopram (CELEXA) 40 mg tablet Take 40 mg by mouth daily.   ??? fluticasone (FLONASE) 50 mcg/actuation nasal spray Apply 1 spray to each nostril as directed daily. Shake bottle gently before using.   ??? HYDROcodone/acetaminophen (NORCO) 5/325 mg tablet Take 1 tablet by mouth every 4 hours as needed for Pain   ??? ondansetron (ZOFRAN ODT) 4 mg rapid dissolve tablet Dissolve 4 mg by mouth every 8 hours as needed for Nausea or Vomiting. Place on tongue to disolve.   ??? pantoprazole DR (PROTONIX) 40 mg tablet Take 40 mg by mouth daily.   ??? prednisone (DELTASONE) 10 mg tablet Take three tablets by mouth daily with breakfast.   ??? [START ON 08/14/2017] prednisone (DELTASONE) 10 mg tablet Take two tablets by mouth daily with breakfast for 14 days.   ??? [START ON 08/29/2017] prednisone (DELTASONE) 10 mg tablet Take one tablet by mouth daily with breakfast for 14 days.   ??? [START ON 09/13/2017] prednisone (DELTASONE) 10 mg tablet Take one-half tablet by mouth daily with breakfast for 14 days.   ??? traMADol (ULTRAM) 50 mg tablet Take 1 tablet by mouth every 6 hours as needed for Pain. Indications: PAIN   ??? zolpidem (AMBIEN) 10 mg tablet Take 10 mg by mouth at bedtime as needed for Sleep.       Physical Exam:    Vitals:  Vital Signs: Last Filed In 24 Hours Vital Signs: 24 Hour Range   BP: 113/73 (11/22 1600)  Temp: 36.7 ???C (98 ???F) (11/22 1600)  Pulse: 67 (11/22 1600)  Respirations: 16 PER MINUTE (11/22 1600)  SpO2: 99 % (11/22 1600)  O2 Delivery: None (Room Air) (11/22 1600)  SpO2 Pulse: 68 (11/22 1530)  Height: 160 cm (63) (11/22 1600) BP: (113-156)/(73-103)   Temp:  [36.7 ???C (98 ???F)-36.9 ???C (98.5 ???F)]   Pulse:  [67-91]   Respirations:  [14 PER MINUTE-18 PER MINUTE]   SpO2:  [98 %-100 %]   O2 Delivery: None (Room Air)   Intensity Pain Scale (Self Report):  10 (08/12/17 1204)      General Appearance: Alert and oriented x4, appears to be in significant pain. Curled in fetal position.  Eyes: No conjunctival injection.  PERRLA.   No scleral icterus. EOMI.   Ears, nose, mouth, and throat: No erythema. Moist mucous membranes, oropharynx without any erythema or edema   Neck:  Some stiffness noted on right side of neck. Patient able to fully flex neck so chin rests upon chest. Also, able to fully extend neck. Symmetric appearance without crepitus, no obvious mass or noticeable swelling  Lungs: Clear breath sounds bilaterally. Good inspiratory effort. No wheezes, rales, or rhonchi.   Heart: Normal rate and regular rhythm, S1, S2 normal, no murmur, rub, or gallop   Abdomen: Soft, diffusely tender to palpation with some guarding but no rigidity, non-distended. Bowel sounds hyperactive in all 4 quadrants. No masses. No organomegaly. Well-healed midline scar noted.  Extremities: Atraumatic, no cyanosis, no edema.   Heme/Lymph: No active bleeding. No cervical, supraclavicular or infraclavicular lymphadenopathy appreciated.   Rectal: Patient refused rectal exam despite discussion of it's indication and need.  Skin: No rashes or lesions  Neurologic: CNII - XII intact upon examination. Strength 5/5 in all extremities upon examination. 2/4 brachial, triceps, patellar, and ankle reflexes bilaterally.    Lab/Radiology/Other Diagnostic Tests:  24-hour labs:    Results for orders placed or performed during the hospital encounter of 08/12/17 (from the past 24 hour(s))   CBC AND DIFF    Collection Time: 08/12/17 12:10 PM   Result Value Ref Range    White Blood Cells 14.0 (H) 4.5 - 11.0 K/UL    RBC 4.90 4.0 - 5.0 M/UL    Hemoglobin 11.7 (L) 12.0 - 15.0 GM/DL    Hematocrit 16.1 36 - 45 %    MCV 76.7 (L) 80 - 100 FL    MCH 23.9 (L) 26 - 34 PG    MCHC 31.2 (L) 32.0 - 36.0 G/DL    RDW 09.6 (H) 11 - 15 %    Platelet Count 697 (H) 150 - 400 K/UL    MPV 8.2 7 - 11 FL    Neutrophils 50 41 - 77 %    Lymphocytes 41 24 - 44 %    Monocytes 7 4 - 12 %    Eosinophils 1 0 - 5 %    Basophils 1 0 - 2 %    Absolute Neutrophil Count 7.00 1.8 - 7.0 K/UL    Absolute Lymph Count 5.70 (H) 1.0 - 4.8 K/UL    Absolute Monocyte Count 1.00 (H) 0 - 0.80 K/UL    Absolute Eosinophil Count 0.10 0 - 0.45 K/UL    Absolute Basophil Count 0.10 0 - 0.20 K/UL   COMPREHENSIVE METABOLIC PANEL    Collection Time: 08/12/17 12:10 PM   Result Value Ref Range Sodium 141 137 - 147 MMOL/L    Potassium 3.0 (L) 3.5 - 5.1 MMOL/L    Chloride 105 98 - 110 MMOL/L    Glucose 94 70 - 100 MG/DL    Blood Urea Nitrogen 5 (L) 7 - 25 MG/DL    Creatinine 0.45 0.4 - 1.00 MG/DL    Calcium 9.0 8.5 - 40.9 MG/DL    Total Protein 6.4 6.0 - 8.0 G/DL    Total Bilirubin 0.2 (L) 0.3 - 1.2 MG/DL    Albumin 3.4 (L) 3.5 - 5.0 G/DL    Alk Phosphatase 811 (H) 25 - 110 U/L    AST (SGOT) 15 7 -  40 U/L    CO2 29 21 - 30 MMOL/L    ALT (SGPT) 20 7 - 56 U/L    Anion Gap 7 3 - 12    eGFR Non African American >60 >60 mL/min    eGFR African American >60 >60 mL/min   LIPASE    Collection Time: 08/12/17 12:10 PM   Result Value Ref Range    Lipase 47 11 - 82 U/L   LACTIC ACID(LACTATE)    Collection Time: 08/12/17 12:10 PM   Result Value Ref Range    Lactic Acid 2.9 (H) 0.5 - 2.0 MMOL/L   C REACTIVE PROTEIN (CRP)    Collection Time: 08/12/17 12:10 PM   Result Value Ref Range    C-Reactive Protein <0.02 <1.0 MG/DL   SED RATE    Collection Time: 08/12/17 12:10 PM   Result Value Ref Range    Sed Rate -ESR 62 (H) 0 - 20 MM/HR   URINALYSIS DIPSTICK REFLEX TO CULTURE    Collection Time: 08/12/17 12:22 PM   Result Value Ref Range    Color,UA STRAW     Turbidity,UA CLEAR CLEAR-CLEAR    Specific Gravity-Urine 1.002 (L) 1.003 - 1.035    pH,UA 6.0 5.0 - 8.0    Protein,UA NEG NEG-NEG    Glucose,UA NEG NEG-NEG    Ketones,UA NEG NEG-NEG    Bilirubin,UA NEG NEG-NEG    Blood,UA NEG NEG-NEG    Urobilinogen,UA NORMAL NORM-NORMAL    Nitrite,UA NEG NEG-NEG    Leukocytes,UA NEG NEG-NEG    Urine Ascorbic Acid, UA NEG NEG-NEG   URINALYSIS MICROSCOPIC REFLEX TO CULTURE    Collection Time: 08/12/17 12:22 PM   Result Value Ref Range    WBCs,UA 0-2 0 - 2 /HPF    RBCs,UA 0-2 0 - 3 /HPF    Comment,UA       Urine submitted for reflex culture if criteria are met:WBC>10, positive nitrite   and/or >=1+ leukocyte esterase. If quantity is not sufficient, an addendum will   follow.      MucousUA TRACE     Squamous Epithelial Cells 0-2 0 - 5 CELL COUNT W/DIFF-CSF    Collection Time: 08/12/17  1:12 PM   Result Value Ref Range    Cell Count Tube,CSF TUBE 4     White Blood Cells,CSF 2 <5 /UL    Red Blood Cells,CSF 1 /UL    Lymphocytes, CSF 100 %    Clarity,CSF CLEAR     Path Interpretation, CSF      Pathologist Signature     CULTURE-CSF W/SENSITIVITY    Collection Time: 08/12/17  1:12 PM   Result Value Ref Range    Battery Name CSF CULTURE     Specimen Description CSF     Special Requests NONE     Direct Gram Stain NO NEUTROPHILS SEEN  NO ORGANISMS SEEN       Culture      Report Status     GRAM STAIN    Collection Time: 08/12/17  1:12 PM   Result Value Ref Range    Battery Name GRAM STAIN      Specimen Description CSF     Special Requests NONE     Gram Stain NO NEUTROPHILS SEEN  NO ORGANISMS SEEN       Report Status FINAL  08/12/2017      GLUCOSE-CSF    Collection Time: 08/12/17  1:12 PM   Result Value Ref Range    Glucose,CSF 59 40 -  75 MG/DL    Xanthrochromia,CSF NONE    TOTAL PROTEIN-CSF    Collection Time: 08/12/17  1:12 PM   Result Value Ref Range    Total Protein,CSF 25 15 - 45 MG/DL     Glucose: 94 (11/91/47 1210)  Urine Pregnancy: (S) Negative (08/12/17 1241)  Pertinent radiology reviewed.

## 2017-08-12 NOTE — Progress Notes
Patient in cart with transport. Assisted by staff into bed. Bed on lowest position, side rails up and call light within reach.

## 2017-08-12 NOTE — ED Notes
[id number]. Ready. Please call Annabelle Harman for report @ [phone]

## 2017-08-12 NOTE — ED Notes
Report given to [id number] RN.

## 2017-08-13 LAB — C DIFFICILE BY PCR

## 2017-08-13 LAB — POC GLUCOSE: Lab: 83 mg/dL (ref >60)

## 2017-08-13 LAB — LEUKOCYTES, FECAL: Lab: NEGATIVE

## 2017-08-13 LAB — COMPREHENSIVE METABOLIC PANEL: Lab: 140 MMOL/L — ABNORMAL LOW (ref >60)

## 2017-08-13 LAB — MAGNESIUM: Lab: 1.9 mg/dL — ABNORMAL LOW (ref 1.6–2.6)

## 2017-08-13 LAB — PHENCYCLIDINES-URINE RANDOM: Lab: NEGATIVE

## 2017-08-13 LAB — BENZODIAZEPINES-URINE RANDOM: Lab: POSITIVE — AB

## 2017-08-13 LAB — AMPHETAMINES-URINE RANDOM: Lab: NEGATIVE

## 2017-08-13 LAB — BETA-HCG: Lab: <1 U/L (ref <5)

## 2017-08-13 LAB — CANNABINOIDS-URINE RANDOM: Lab: NEGATIVE

## 2017-08-13 LAB — OPIATES-URINE RANDOM: Lab: NEGATIVE

## 2017-08-13 LAB — PHOSPHORUS: Lab: 3.6 mg/dL — ABNORMAL LOW (ref >40)

## 2017-08-13 LAB — AMYLASE: Lab: 51 U/L (ref 24–100)

## 2017-08-13 LAB — COCAINE-URINE RANDOM: Lab: NEGATIVE

## 2017-08-13 LAB — ALCOHOL LEVEL: Lab: <10 mg/dL

## 2017-08-13 LAB — CBC AND DIFF: Lab: 12 K/UL — ABNORMAL HIGH (ref 4.5–11.0)

## 2017-08-13 LAB — CELL COUNT W/DIFF-CSF

## 2017-08-13 LAB — BARBITURATES-URINE RANDOM: Lab: NEGATIVE

## 2017-08-13 LAB — LACTIC ACID(LACTATE): Lab: 1.5 MMOL/L (ref 0.5–2.0)

## 2017-08-13 LAB — LIPASE: Lab: 17 U/L (ref 11–82)

## 2017-08-13 MED ORDER — LEVETIRACETAM 500 MG PO TAB
500 mg | Freq: Two times a day (BID) | ORAL | 0 refills | Status: DC
Start: 2017-08-13 — End: 2017-08-13

## 2017-08-13 MED ORDER — PEG-ELECTROLYTE SOLN 420 GRAM PO SOLR
4 L | Freq: Once | ORAL | 0 refills | Status: CP
Start: 2017-08-13 — End: ?
  Administered 2017-08-14: 02:00:00 4 L via ORAL

## 2017-08-13 MED ORDER — PEG-ELECTROLYTE SOLN 420 GRAM PO SOLR
2 L | ORAL | 0 refills | Status: DC | PRN
Start: 2017-08-13 — End: 2017-08-17

## 2017-08-13 MED ORDER — MORPHINE 2 MG/ML IV CRTG
2 mg | Freq: Once | INTRAVENOUS | 0 refills | Status: DC
Start: 2017-08-13 — End: 2017-08-13

## 2017-08-13 MED ORDER — LORAZEPAM 2 MG/ML IJ SOLN
1 mg | Freq: Once | INTRAVENOUS | 0 refills | Status: CP
Start: 2017-08-13 — End: ?
  Administered 2017-08-13: 18:00:00 1 mg via INTRAVENOUS

## 2017-08-13 MED ORDER — LACTATED RINGERS IV SOLP
INTRAVENOUS | 0 refills | Status: AC
Start: 2017-08-13 — End: ?
  Administered 2017-08-13: 18:00:00 1000.000 mL via INTRAVENOUS

## 2017-08-13 MED ORDER — PYRIDOXINE (VITAMIN B6) 50 MG PO TAB
50 mg | Freq: Every day | ORAL | 0 refills | Status: DC
Start: 2017-08-13 — End: 2017-08-17
  Administered 2017-08-13 – 2017-08-16 (×4): 50 mg via ORAL

## 2017-08-13 MED ORDER — LEVETIRACETAM 500 MG PO TAB
500 mg | Freq: Two times a day (BID) | ORAL | 0 refills | Status: DC
Start: 2017-08-13 — End: 2017-08-14
  Administered 2017-08-13 (×2): 500 mg via ORAL

## 2017-08-13 MED ORDER — PROCHLORPERAZINE MALEATE 10 MG PO TAB
10 mg | ORAL | 0 refills | Status: DC | PRN
Start: 2017-08-13 — End: 2017-08-17
  Administered 2017-08-16: 19:00:00 10 mg via ORAL

## 2017-08-13 MED ORDER — MORPHINE 2 MG/ML IV CRTG
2 mg | Freq: Once | INTRAVENOUS | 0 refills | Status: CP
Start: 2017-08-13 — End: ?
  Administered 2017-08-13: 16:00:00 2 mg via INTRAVENOUS

## 2017-08-13 MED ORDER — MELATONIN 5 MG PO TAB
5 mg | Freq: Every evening | ORAL | 0 refills | Status: DC
Start: 2017-08-13 — End: 2017-08-17
  Administered 2017-08-14 – 2017-08-16 (×3): 5 mg via ORAL

## 2017-08-13 MED ORDER — OXYCODONE 5 MG PO TAB
5-10 mg | ORAL | 0 refills | Status: DC | PRN
Start: 2017-08-13 — End: 2017-08-17
  Administered 2017-08-14 (×3): 10 mg via ORAL
  Administered 2017-08-14 (×2): 5 mg via ORAL
  Administered 2017-08-14: 03:00:00 10 mg via ORAL
  Administered 2017-08-14: 5 mg via ORAL
  Administered 2017-08-15 – 2017-08-16 (×12): 10 mg via ORAL

## 2017-08-13 MED ORDER — TRAZODONE 50 MG PO TAB
50 mg | Freq: Once | ORAL | 0 refills | Status: CP | PRN
Start: 2017-08-13 — End: ?
  Administered 2017-08-14: 03:00:00 50 mg via ORAL

## 2017-08-13 MED ORDER — BISACODYL 5 MG PO TBEC
10 mg | Freq: Once | ORAL | 0 refills | Status: AC | PRN
Start: 2017-08-13 — End: ?

## 2017-08-13 MED ORDER — PEG-ELECTROLYTE SOLN 420 GRAM PO SOLR
4 L | ORAL | 0 refills | Status: DC
Start: 2017-08-13 — End: 2017-08-17

## 2017-08-13 MED ORDER — OXYCODONE 5 MG PO TAB
5 mg | Freq: Once | ORAL | 0 refills | Status: CP
Start: 2017-08-13 — End: ?
  Administered 2017-08-13: 10:00:00 5 mg via ORAL

## 2017-08-13 NOTE — Progress Notes
General Progress Note    Name:  Alyssa Cochran   ZOXWR'U Date:  08/13/2017  Admission Date: 08/12/2017  LOS: 1 day                     Assessment/Plan:    Active Problems:    Abdominal pain    Seizure [facility])    36 y.o. female with PMH of Crohn's Disease, depression, anxiety, and recent hospitalization from 11/8-11/9 for possible seizure (left AMA) who presents with 2 more episodes of reported seizures, headache, abdominal pain, and nausea/vomiting.    ## Seizure  ## Right Sided Headache  - Patient initially hospitalized for reported seizure from 11/8-11/9 - left AMA from this hospitalization before full work-up could be completed  - Patient reports additional seizure episode on 11/11 and last on 08/11/2017  - Last seizure witnessed by son - describing tonic-clonic movements. One seizure associated with bladder incontinence.  - S/p EEG 07/30/2017: intermittent generalized theta slowing indicative of a mild encephalopathy.  No epileptiform activity or lateralizing signs seen.  - CT Head 07/29/2017: no acute intracranial hemorrhage or calvarial fracture  - S/p LP in ED 08/12/2017: WBC 2, RBC 1, glucose 59, protein 25 - appear to be normal findings   PLAN  > MRI brain no infact or epileptic focus  > Neuro seen in the ED and started on Keppra 500 mg BID and Pyridoxine   -Were present in room today for seizure episode, but pt remained conscious and was sitting in chair writhing in pain.   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 Recommended D/c Keppra.  > D/C tramadol   > Seizure Precautions  > Will hold off on Abx for possible meningitis as CSF findings not consistent with bacterial meningitis.    ## Abdominal Pain  ## Hx of Crohn's Disease  ## Nausea/Vomiting  ## Diarrhea with Intermittent Hematochezia  - DDx for abdominal pain: Crohn's flare vs. gastroenteritis  - Originally seen by Dr. Phil Dopp at [facility] gastroenterology in [place] - EGD and colonoscopy 2015- EGD without any abnormality. Colonoscopy showed scattered areas of mild erythematous mucosa the left side of the colon, small bowel biopsy negative for celiac disease, rectosigmoid biopsies consistent with focal active colitis and other colonic biopsies consistent with melanosis coli    - Flex sigmoidoscopy 07/2015 by Dr. Phil Dopp - erythematous mucosa along the rectosigmoid with freability and biopsy consistent with chronic inflammation   - H/o of small bowel resection (10 inches) on 12/2015 at [facility], [place] for intestinal obstruction due to intussception. Pathology showed findings consistent with crohn's disease as per pt   - CT Abd/Pelv 118/2018: Apparent wall thickening and enhancement of the terminal ileum as well as mild enhancement of the ascending colon, which may represent inflammatory enterocolitis given the history of Crohn's disease.  - Has been on 40 mg Prednisone qday for over a year    PLAN  > Will continue prednisone 40 mg Qday  > GI consult, appreciate recs  > CT abdomen/pelvis showed resolution of inflammatory focus previously seen in the ileum. Showed fluid distension that was most likely physiologic.   > Stool Culture, fecal leukocytes, and c. Diff testing pending  > Ordered amylase and lipase  > PRN anti-emetics  > PRN oxycodone for abdominal pain. Given one dose of IV morphine 2 mg, but will hold off and try to treat further with PO.    ## Lactic Acidosis   -  LA 2.9 on admit - likely from nausea/vomiting with poor oral intake  - Resolved, will continue to monitor    ## Hypokalemia  - Providing oral and IV replacement    ## Depression  - Continue PTA citalopram    #Severe Malnutrition  - BMI 18.2  -Consult dietary and placed calorie count    Fluids, electrolytes and Nutrition:  IVF: None  Electrolytes: replacing prn  Diet: Diet Regular    Prophylaxis:  DVT: Lovenox    Code status: Full Code  Disposition: Admit to Med 1. Patient seen and discussed with Dr. Pauline Good, MD  Internal Medicine PGY-1    Pager [phone]  ________________________________________________________________________    Subjective  Alyssa Cochran is a 35 y.o. female.  The team was called to the bedside today as the patient states she feels she is going to have a seizure.  Neurology was also about to see patient and entered the room to assess.  The neurology resident spoke with the patient during her seizure episode, and she never lost consciousness during this episode.  She did however sit in the chair and was shaking and writhing stating that she was scared.  A point-of-care glucose was taken and was 83. She was given 1 mg of Ativan for this seizure.  Otherwise she states her pain is out of control and not controlled with the p.o. medicine. She also states that she cannot sleep and is asking for Ambien to sleep.    Medications  Scheduled Meds:  citalopram (CELEXA) tablet 40 mg 40 mg Oral QDAY   enoxaparin (LOVENOX) syringe 40 mg 40 mg Subcutaneous QDAY(21)   levETIRAcetam (KEPPRA) tablet 500 mg 500 mg Oral BID   morphine injection syringe 2 mg 2 mg Intravenous ONCE   pantoprazole DR (PROTONIX) tablet 40 mg 40 mg Oral QDAY   prednisone (DELTASONE) tablet 40 mg 40 mg Oral QDAY   pyridoxine (VITAMIN B-6) tablet 50 mg 50 mg Oral QDAY   Continuous Infusions:  PRN and Respiratory Meds:ondansetron (ZOFRAN) IV Q6H PRN, oxyCODONE Q4H PRN    Objective:                          Vital Signs: Last Filed                 Vital Signs: 24 Hour Range   BP: 109/72 (11/23 0733)  Temp: 36.8 ???C (98.2 ???F) (11/23 6213)  Pulse: 76 (11/23 0733)  Respirations: 18 PER MINUTE (11/23 0733)  SpO2: 98 % (11/23 0733)  O2 Delivery: None (Room Air) (11/23 0733)  SpO2 Pulse: 68 (11/22 1530)  Height: 160 cm (63) (11/22 1600) BP: (109-156)/(67-103)   Temp:  [36.6 ???C (97.9 ???F)-36.9 ???C (98.5 ???F)]   Pulse:  [67-91]   Respirations:  [14 PER MINUTE-18 PER MINUTE] SpO2:  [98 %-100 %]   O2 Delivery: None (Room Air)   Intensity Pain Scale (Self Report): 8 (08/13/17 0358) Vitals:    08/12/17 1202 08/12/17 1600   Weight: 43.1 kg (95 lb) 46.6 kg (102 lb 11.8 oz)       Intake/Output Summary:  (Last 24 hours)    Intake/Output Summary (Last 24 hours) at 08/13/17 0903  Last data filed at 08/13/17 0700   Gross per 24 hour   Intake             2794 ml   Output  1200 ml   Net             1594 ml      Stool Occurrence: 0    Physical Exam  General appearance: fatigued, poor nutritional state, uncooperative, mild distress, toxic and cachectic  Neurologic: Grossly normal  Lungs: clear to auscultation bilaterally  Heart: regular rate and rhythm  Abdomen: abnormal findings:  diffusely tender to palpation throughout her abdomen. Pt becomes overly tearful on abdominal exam.  Extremities: extremities normal, atraumatic, no cyanosis or edema    Lab Review  Pertinent labs reviewed    Point of Care Testing  (Last 24 hours)  Glucose: (!) 212 (08/13/17 0504)  Urine Pregnancy: (S) Negative (08/12/17 1241)    Radiology and other Diagnostics Review:    Pertinent radiology reviewed.    Richarda Osmond, MD   Pager [phone]

## 2017-08-13 NOTE — Patient Education
Medication Education    Alyssa Cochran accepted counseling and was engaged.  she verbalized understanding.    The following medications were discussed:  Keppra, Vitamin B-6, tramadol, oxycodone, protonix, prednisone.     Where indicated, the patient was provided with additional medication and/or disease-state information.  All patient questions were answered and patient acknowledged understanding of the medications, side effects and other pertinent medication information.    Follow up should occur daily.    Continue to address: indications and side effects of opioids.     [name], RN

## 2017-08-13 NOTE — Consults
CLINICAL NUTRITION                                                        Clinical Nutrition Assessment Summary     NAME:Alyssa Cochran             MRN: [id number]             DOB:12/28/1980          AGE: 36 y.o.  ADMISSION DATE: 08/12/2017             DAYS ADMITTED: LOS: 1 day    Nutrition Assessment of Patient:  BMI Categories Adult: Under Weight: < 18.5 (BMI 18.2)  Malnutrition Assessment:  (subjective info pending)  Current Oral Intake: Marginally Adequate  Estimated Calorie Needs: 1400-1630 kcal (30-35 kcal/kg present wt 46.6kg)  Estimated Protein Needs: 70g (1.5g/kg present wt 46.6kg)  Oral Diet Order: Regular    Comments:  36 y.o. female with PMH of Crohn's Disease, depression, anxiety, and recent hospitalization from 11/8-11/9 for possible seizure (left AMA) who presents with 2 more episodes of reported seizures, headache, abdominal pain, and nausea/vomiting. Consult received for nutritional assessment. Appreciate the consult. Pt was visibly upset during attempted visit this a.m. & declined RD visit at this time. EMR reviewed. She was seen by clinical nutrition services recently, on 11/09. At that time, she reported a UBW of 80-90# which she had last weighed a few months ago. Noted on 11/9, she weighed 97#. Current wt 102# (no edema). Unable to complete physical assessment at this time. She is on a regular diet currently, no intakes to observe thus far. Calorie count initiated. Will continue to monitor.     Recommendation:  Continue regular vs. low fiber diet. Encourage intake of Boost Plus (or supplement of pt's choice) TID                         Intervention / Plan:  Will monitor PO intake adequacy/tolerance  Will monitor GI function, wt trends, labs  Will order Boost Plus TID    Nutrition Diagnosis:  Increased nutrient needs, specify: (kcal, protein)  Etiology: altered GI function, malabsorption  Signs & Symptoms: crohns, nausea/vomiting, abdominal pain    Goals: Patient to consume >75% of meals/supplements  Time Frame: Within 72 Hours      [place] Luetkemeyer, MS, RD, LD  Pager: [phone]  Phone: [phone]

## 2017-08-13 NOTE — Progress Notes
2140: Pt alert and oriented x4. Requesting ambien for sleep. M1notified    2240: Pt tearful and restless. Requesting additional pain medication and sleeping aid. Pt also had one episode of emesis. M1 notified.  No additional pain medications added at this time. Aromatherapy and heating pad given.  PO zofran switched to IV. Trazodone ordered by Dr. Liborio Nixon, MD.     0320: Pt continues to be tearful and feels anxious due to pain. Requesting anxiety medications. M1 notified.     Dr. Liborio Nixon, MD at bedside.  One time dose of oxycodone ordered.

## 2017-08-13 NOTE — Progress Notes
Pt requesting to take a shower and states that she does not feel comfortable with staff staying with her while she showers. This RN explained that staff stays with high fall risk patients any time they are out of bed according to hospital policy in order to ensure patient safety. This RN explained to the patient that she is at risk of having a seizure, which puts her at risk of injury as well. The patient verbalized understanding, but continues to refuse staff with her while in shower. Updated primary RN Asleigh and PCA Rhi.

## 2017-08-14 ENCOUNTER — Inpatient Hospital Stay: Admit: 2017-08-14 | Discharge: 2017-08-14 | Payer: BC Managed Care – HMO | Primary: Adult Health

## 2017-08-14 ENCOUNTER — Encounter: Admit: 2017-08-14 | Discharge: 2017-08-14 | Payer: BC Managed Care – HMO | Primary: Adult Health

## 2017-08-14 DIAGNOSIS — I519 Heart disease, unspecified: Principal | ICD-10-CM

## 2017-08-14 DIAGNOSIS — R109 Unspecified abdominal pain: ICD-10-CM

## 2017-08-14 DIAGNOSIS — M549 Dorsalgia, unspecified: ICD-10-CM

## 2017-08-14 LAB — POC GLUCOSE: Lab: 114 mg/dL — ABNORMAL HIGH (ref 70–100)

## 2017-08-14 LAB — DIRECT EXAM (WET PREP)

## 2017-08-14 LAB — PROTIME INR (PT): Lab: 1 % — ABNORMAL LOW (ref 0.8–1.2)

## 2017-08-14 LAB — COMPREHENSIVE METABOLIC PANEL
Lab: 145 MMOL/L — ABNORMAL LOW (ref 137–147)
Lab: 4.1 MMOL/L — ABNORMAL LOW (ref >60)

## 2017-08-14 LAB — MAGNESIUM: Lab: 1.8 mg/dL — ABNORMAL LOW (ref >60)

## 2017-08-14 LAB — CBC AND DIFF: Lab: 15 K/UL — ABNORMAL HIGH (ref >60)

## 2017-08-14 LAB — RETICULOCYTE COUNT: Lab: 1.3 % — ABNORMAL LOW (ref 0.5–2.0)

## 2017-08-14 LAB — PHOSPHORUS: Lab: 4.4 mg/dL — ABNORMAL LOW (ref >60)

## 2017-08-14 MED ORDER — MIDAZOLAM 1 MG/ML IJ SOLN
INTRAVENOUS | 0 refills | Status: DC
Start: 2017-08-14 — End: 2017-08-14
  Administered 2017-08-14 (×2): 1 mg via INTRAVENOUS
  Administered 2017-08-14: 15:00:00 3 mg via INTRAVENOUS

## 2017-08-14 MED ORDER — FLUCONAZOLE 100 MG PO TAB
200 mg | Freq: Every day | ORAL | 0 refills | Status: DC
Start: 2017-08-14 — End: 2017-08-14

## 2017-08-14 MED ORDER — HALOPERIDOL LACTATE 5 MG/ML IJ SOLN
1 mg | Freq: Once | INTRAVENOUS | 0 refills | Status: DC | PRN
Start: 2017-08-14 — End: 2017-08-14

## 2017-08-14 MED ORDER — OXYCODONE 5 MG PO TAB
10 mg | Freq: Once | ORAL | 0 refills | Status: CP
Start: 2017-08-14 — End: ?
  Administered 2017-08-14: 18:00:00 10 mg via ORAL

## 2017-08-14 MED ORDER — SENNOSIDES-DOCUSATE SODIUM 8.6-50 MG PO TAB
1 | Freq: Two times a day (BID) | ORAL | 0 refills | Status: DC
Start: 2017-08-14 — End: 2017-08-17
  Administered 2017-08-14 – 2017-08-16 (×4): 1 via ORAL

## 2017-08-14 MED ORDER — DOCUSATE SODIUM 100 MG PO CAP
100 mg | Freq: Two times a day (BID) | ORAL | 0 refills | Status: DC
Start: 2017-08-14 — End: 2017-08-14

## 2017-08-14 MED ORDER — LIDOCAINE (PF) 10 MG/ML (1 %) IJ SOLN
.1-2 mL | INTRAMUSCULAR | 0 refills | Status: DC | PRN
Start: 2017-08-14 — End: 2017-08-14

## 2017-08-14 MED ORDER — FLU VACC QS2018-19 6MOS UP(PF) 60 MCG (15 MCG X 4)/0.5 ML IM SYRG
.5 mL | Freq: Once | INTRAMUSCULAR | 0 refills | Status: DC
Start: 2017-08-14 — End: 2017-08-17

## 2017-08-14 MED ORDER — FLUCONAZOLE 100 MG PO TAB
200 mg | Freq: Every day | ORAL | 0 refills | Status: DC
Start: 2017-08-14 — End: 2017-08-17
  Administered 2017-08-15 – 2017-08-16 (×2): 200 mg via ORAL

## 2017-08-14 MED ORDER — FLUCONAZOLE IN NACL (ISO-OSM) 400 MG/200 ML IV PGBK
400 mg | Freq: Once | INTRAVENOUS | 0 refills | Status: CP
Start: 2017-08-14 — End: ?
  Administered 2017-08-14: 20:00:00 400 mg via INTRAVENOUS

## 2017-08-14 MED ORDER — LIDOCAINE (PF) 200 MG/10 ML (2 %) IJ SYRG
0 refills | Status: DC
Start: 2017-08-14 — End: 2017-08-14
  Administered 2017-08-14: 15:00:00 40 mg via INTRAVENOUS

## 2017-08-14 MED ORDER — LACTATED RINGERS IV SOLP
1000 mL | INTRAVENOUS | 0 refills | Status: DC
Start: 2017-08-14 — End: 2017-08-15
  Administered 2017-08-14: 13:00:00 1000 mL via INTRAVENOUS

## 2017-08-14 MED ORDER — PROPOFOL 10 MG/ML IV EMUL 20 ML (INFUSION)(AM)(OR)
INTRAVENOUS | 0 refills | Status: DC
Start: 2017-08-14 — End: 2017-08-14
  Administered 2017-08-14: 15:00:00 20.000 mL via INTRAVENOUS
  Administered 2017-08-14: 15:00:00 50000 ug via INTRAVENOUS
  Administered 2017-08-14: 15:00:00 120 ug/kg/min via INTRAVENOUS

## 2017-08-14 MED ORDER — SODIUM CHLORIDE 0.9 % IV SOLP
INTRAVENOUS | 0 refills | Status: DC
Start: 2017-08-14 — End: 2017-08-15

## 2017-08-14 MED ORDER — TRAZODONE 50 MG PO TAB
50 mg | Freq: Every evening | ORAL | 0 refills | Status: DC | PRN
Start: 2017-08-14 — End: 2017-08-17
  Administered 2017-08-15 – 2017-08-16 (×2): 50 mg via ORAL

## 2017-08-14 MED ORDER — IMS MIXTURE TEMPLATE
35 mg | Freq: Every day | ORAL | 0 refills | Status: DC
Start: 2017-08-14 — End: 2017-08-17
  Administered 2017-08-15 – 2017-08-16 (×4): 35 mg via ORAL

## 2017-08-14 NOTE — Anesthesia Post-Procedure Evaluation
Post-Anesthesia Evaluation    Name: Alyssa Cochran      MRN: [id number]     DOB: January 04, 1981     Age: 36 y.o.     Sex: female   __________________________________________________________________________     Procedure Date: 08/14/2017  Procedure: Procedure(s):  ESOPHAGOGASTRODUODENOSCOPY WITH BIOPSY - FLEXIBLE  COLONOSCOPY DIAGNOSTIC WITH SPECIMEN COLLECTION BY BRUSHING/ WASHING - FLEXIBLE      Surgeon: Surgeon(s):  Everardo All, MD    Post-Anesthesia Vitals  BP: 120/90 (11/24 0930)  Temp: 36.2 C (97.2 F) (11/24 0930)  SpO2: 96 % (11/24 0930)  O2 Delivery: None (Room Air) (11/24 0930)  SpO2 Pulse: 74 (11/24 0930)      Post Anesthesia Evaluation Note    Evaluation location: Pre/Post  Patient participation: recovered; patient participated in evaluation  Level of consciousness: alert    Pain score: 0  Pain management: adequate    Hydration: normovolemia  Temperature: 36.0C - 38.4C  Airway patency: adequate    Perioperative Events  Perioperative events:  no       Post-op nausea and vomiting: no PONV    Postoperative Status  Cardiovascular status: hemodynamically stable  Respiratory status: spontaneous ventilation  Follow-up needed: none        Perioperative Events  Perioperative Event: No  Emergency Case Activation: No

## 2017-08-14 NOTE — Progress Notes
Recvd report from [name], Charity fundraiser. Pt off unit for upper & lower GI. Will continue to follow.

## 2017-08-14 NOTE — Progress Notes
General Progress Note    Name:  Alyssa Cochran   [id number] Date:  08/14/2017  Admission Date: 08/12/2017  LOS: 2 days                     Assessment/Plan:    Active Problems:    Abdominal pain    Seizure [facility])    36 y.o. female with PMH of Crohn's Disease, depression, anxiety, and recent hospitalization from 11/8-11/9 for possible seizure (left AMA) who presents with 2 more episodes of reported seizures, headache, abdominal pain, and nausea/vomiting.    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 One seizure associated with bladder incontinence.  - S/p EEG 07/30/2017: intermittent generalized theta slowing indicative of a mild encephalopathy.  No epileptiform activity or lateralizing signs seen.  - CT Head 07/29/2017: no acute intracranial hemorrhage or calvarial fracture  - S/p LP in ED 08/12/2017: WBC 2, RBC 1, glucose 59, protein 25 - appear to be normal findings   PLAN  > MRI brain no infact or epileptic focus  > Neuro seen in the ED and started on Keppra 500 mg BID and Pyridoxine   -Were present in room today for seizure episode, but pt remained conscious and was sitting in chair writhing in pain.   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 Recommended D/c Keppra.  > D/C tramadol   > Seizure Precautions  > Will hold off on Abx for possible meningitis as CSF findings not consistent with bacterial meningitis.    ## Abdominal Pain  ## Hx of Crohn's Disease  ## Nausea/Vomiting  ## Esophageal Candidiasis  - DDx for abdominal pain: Crohn's flare vs. gastroenteritis - Originally seen by Dr. Phil Dopp at [facility] gastroenterology in [place]  - EGD and colonoscopy 2015- EGD without any abnormality. Colonoscopy showed scattered areas of mild erythematous mucosa the left side of the colon, small bowel biopsy negative for celiac disease, rectosigmoid biopsies consistent with focal active colitis and other colonic biopsies consistent with melanosis coli    - Flex sigmoidoscopy 07/2015 by Dr. 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 Pathology showed findings consistent with crohn's disease as per pt   - CT Abd/Pelv 118/2018: Apparent wall thickening and enhancement of the terminal ileum as well as mild enhancement of the ascending colon, which may represent inflammatory enterocolitis given the history of Crohn's disease.  - Has been on 40 mg Prednisone qday for over a year   - Fecal leukocytes negative and C. Diff negative  - Amylase and lipase non-elevated   - EGD 08/14/2017: Esophageal candidiasis seen throughout the entire esophagus, likely in relation to patient's chronic prednisone use. Normal appearing stomach and duodenum. Biopsies taken for H. pylori and celiac rule out   - Colonoscopy 08/14/2017: Diffusely normal-appearing colon without evidence of erythema, friability, edema or signs of active colitis. Normal-appearing terminal ileum. Biopsies were taken randomly throughout the colon for evaluation of colitis   PLAN  > Will  work on steroid taper - decrease prednisone to 35 mg qday tomorrow  > GI consult, appreciate recs  > Will start empiric fluconazole for esophageal candidiasis - plan for 14-21 day course  > Consulting OB/GYN to evaluate for possible gynecologic cause for pain  > Will check spot urine porphyrins > Plan to have patency capsule performed while in the hospital and be set up for outpatient capsule endoscopy   > F/u biopsies from endoscopy procedures  > CT abdomen/pelvis showed resolution of inflammatory focus previously seen in the ileum. Showed fluid distension that was most likely physiologic.   > PRN anti-emetics  > PRN oxycodone for abdominal pain. Will attempt to stay away from IV pain medications    ## Lactic Acidosis - reolved   - LA 2.9 on admit - likely from nausea/vomiting with poor oral intake  - Resolved, will continue to monitor    ## Hypokalemia - Resolved    ## Depression  - Continue PTA citalopram    #Severe Malnutrition  # Vitamin B6 Deficiency  - BMI 18.2  - Vitamin B6 level low at 3  PLAN  > Consult dietary and placed calorie count  > Providing supplementary vitamin B6    Fluids, electrolytes and Nutrition:  IVF: None  Electrolytes: replacing prn  Diet: Diet Regular    Prophylaxis:  DVT: Lovenox    Code status: Full Code  Disposition: Continued admit to Med-1.    Patient seen and discussed with Dr. Marya Fossa, M.D.  Internal Medicine Resident - PGY3  Pager [phone]  ________________________________________________________________________    Subjective  Alyssa Cochran is a 36 y.o. female.  Alyssa Cochran had no acute events overnight. She was seen in AM in pre-procedure before her endoscopies. She was noted to be extremely tearful and anxious. She stated she had significant anxiety about her upcoming procedures. This provider provided reassurance that procedures would help team discover what is causing her abdominal pain and will be conducted safely. She continues to report diffuse abdominal pain.    Medications  Scheduled Meds:    [MAR Hold] citalopram (CELEXA) tablet 40 mg 40 mg Oral QDAY   [MAR Hold] electrolyte GUT PEG (NULYTELY, COLYTE, GAVILYTE-N) oral solution 4 L 4 L Oral As Prescribed   [MAR Hold] enoxaparin (LOVENOX) syringe 40 mg 40 mg Subcutaneous QDAY(21) [MAR Hold] influenza (=>6 MO)(QUADrivalent) (FLULAVAL) 60 mcg (15 mcg x 4)/0.5 mL 2018-19 PF syringe 0.5 mL 0.5 mL Intramuscular ONCE   [MAR Hold] melatonin tablet 5 mg 5 mg Oral QHS   [MAR Hold] pantoprazole DR (PROTONIX) tablet 40 mg 40 mg Oral QDAY   [MAR Hold] prednisone (DELTASONE) tablet 40 mg 40 mg Oral QDAY   [MAR Hold] pyridoxine (VITAMIN B-6) tablet 50 mg 50 mg Oral QDAY   Continuous Infusions:  ??? lactated ringers infusion     ??? lactated ringers infusion 75 mL/hr at 08/13/17 1131     PRN and Respiratory Meds:[MAR Hold] electrolyte GUT PEG PRN (On Call from Rx), [MAR Hold] lidocaine PF PRN, [MAR Hold] ondansetron (ZOFRAN) IV Q6H PRN, [MAR Hold] oxyCODONE Q3H PRN, [MAR Hold] prochlorperazine Q6H PRN    Objective:                          Vital Signs: Last Filed                 Vital Signs: 24 Hour Range   BP: 145/92 (11/24 0646)  Temp: (P) 36.8 ???C (98.2 ???F) (11/24 1610)  Pulse: 76 (11/24 0648)  Respirations: 18 PER MINUTE (11/24 0648)  SpO2: 98 % (11/24 0648)  O2 Delivery: (P) None (Room Air) (11/24 9604)  SpO2 Pulse: 76 (11/24 0648) BP: (109-145)/(72-92)   Temp:  [36.8 ???C (98.2 ???F)-37 ???C (98.6 ???F)]   Pulse:  [66-92]   Respirations:  [18 PER MINUTE-24 PER MINUTE]   SpO2:  [93 %-99 %]   O2 Delivery: (P) None (Room Air)   Intensity Pain Scale (Self Report): 10 (08/14/17 0041) Vitals:    08/12/17 1202 08/12/17 1600   Weight: 43.1 kg (95 lb) 46.6 kg (102 lb 11.8 oz)       Intake/Output Summary:  (Last 24 hours)    Intake/Output Summary (Last 24 hours) at 08/14/17 5409  Last data filed at 08/13/17 2337   Gross per 24 hour   Intake             3180 ml   Output             1200 ml   Net             1980 ml      Stool Occurrence: 2 (RN Ashleigh notified )    Physical Exam  General appearance: fatigued, poor nutritional state, uncooperative, mild distress, cachectic. Patient very tearful  Neurologic: Grossly normal  Lungs: clear to auscultation bilaterally  Heart: regular rate and rhythm Abdomen: abnormal findings:  diffusely tender to palpation throughout her abdomen with some guarding. No rigidity noted.  Extremities: extremities normal, atraumatic, no cyanosis or edema    Lab Review  Pertinent labs reviewed    Point of Care Testing  (Last 24 hours)  Glucose: 96 (08/14/17 0508)  POC Glucose (Download): 83 (08/13/17 1108)    Radiology and other Diagnostics Review:    Pertinent radiology reviewed.    Verita Lamb, M.D.  Internal Medicine Resident - PGY3  Pager [phone]

## 2017-08-14 NOTE — Progress Notes
Patient refusing the high fall risk bundle including no staff with her in the restroom and having the bed alarm in place. This RN spoke to patient about the risks of staff not being in the room while out of bed, especially with patient's risk of seizure. Patient verbalized understanding, but continues to refuse high fall risk bundle. This RN updated [name], RN and Lexi, PCA. Adam, RN to update primary team.

## 2017-08-14 NOTE — Anesthesia Pre-Procedure Evaluation
Anesthesia Pre-Procedure Evaluation    Name: Alyssa Cochran      MRN: [id number]     DOB: 1980-12-11     Age: 36 y.o.     Sex: female   __________________________________________________________________________     Procedure Date: 08/14/2017   Procedure: Procedure(s):  ESOPHAGOGASTRODUODENOSCOPY WITH BIOPSY - FLEXIBLE  COLONOSCOPY DIAGNOSTIC WITH SPECIMEN COLLECTION BY BRUSHING/ WASHING - FLEXIBLE     Physical Assessment  Vital Signs (last filed in past 24 hours):  BP: 145/92 (11/24 0646)  Temp: 36.8 ???C (98.2 ???F) (11/24 4540)  Pulse: 76 (11/24 0648)  Respirations: 18 PER MINUTE (11/24 0648)  SpO2: 98 % (11/24 0648)  O2 Delivery: None (Room Air) (11/24 9811)      Patient History  No Known Allergies     Current Medications    Medication Directions   albuterol 0.083% (PROVENTIL; VENTOLIN) 2.5 mg /3 mL (0.083 %) nebulizer solution Inhale 3 mL solution by nebulizer as directed every 6 hours as needed for Wheezing or Shortness of Breath.   brexpiprazole 1 mg tab Take 1 tablet by mouth daily.   citalopram (CELEXA) 40 mg tablet Take 40 mg by mouth daily.   fluticasone (FLONASE) 50 mcg/actuation nasal spray Apply 1 spray to each nostril as directed daily. Shake bottle gently before using.   HYDROcodone/acetaminophen (NORCO) 5/325 mg tablet Take 1 tablet by mouth every 4 hours as needed for Pain   ondansetron (ZOFRAN ODT) 4 mg rapid dissolve tablet Dissolve 4 mg by mouth every 8 hours as needed for Nausea or Vomiting. Place on tongue to disolve.   pantoprazole DR (PROTONIX) 40 mg tablet Take 40 mg by mouth daily.   prednisone (DELTASONE) 10 mg tablet Take three tablets by mouth daily with breakfast.   prednisone (DELTASONE) 10 mg tablet Take two tablets by mouth daily with breakfast for 14 days.   prednisone (DELTASONE) 10 mg tablet Take one tablet by mouth daily with breakfast for 14 days.   prednisone (DELTASONE) 10 mg tablet Take one-half tablet by mouth daily with breakfast for 14 days. traMADol (ULTRAM) 50 mg tablet Take 1 tablet by mouth every 6 hours as needed for Pain. Indications: PAIN   zolpidem (AMBIEN) 10 mg tablet Take 10 mg by mouth at bedtime as needed for Sleep.         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 no current problem with liver dysfunction)     No renal disease      Neuro/Psych       Seizures      Chronic opioid use        Psychiatric history          Anxiety      Musculoskeletal  Back pain      Endocrine/Other       No diabetes      No hypothyroidism      Anemia   Physical Exam    Airway Findings      Mallampati: II      TM distance: >3 FB      Neck ROM: full      Mouth opening: good      Airway patency: adequate    Dental Findings:       Upper dentures and lower dentures    Cardiovascular Findings:       Rhythm: regular      Rate: normal      No murmur    Pulmonary Findings:       Breath sounds clear to auscultation.       Diagnostic Tests  Hematology:   Lab Results   Component Value Date    HGB 9.4 08/14/2017    HCT 30.6 08/14/2017    PLTCT 555 08/14/2017    WBC 15.6 08/14/2017    NEUT 51 08/14/2017    ANC 8.00 08/14/2017    ALC 6.40 08/14/2017    MONA 7 08/14/2017    AMC 1.10 08/14/2017    EOSA 0 08/14/2017    ABC 0.10 08/14/2017    MCV 79.1 08/14/2017    MCH 24.4 08/14/2017    MCHC 30.9 08/14/2017    MPV 8.2 08/14/2017    RDW 19.9 08/14/2017         General Chemistry:   Lab Results   Component Value Date    NA 145 08/14/2017    K 4.1 08/14/2017    CL 107 08/14/2017    CO2 32 08/14/2017 GAP 6 08/14/2017    BUN <2 08/14/2017    CR 0.47 08/14/2017    GLU 96 08/14/2017    CA 8.8 08/14/2017    ALBUMIN 2.9 08/14/2017    LACTIC 1.5 08/12/2017    MG 1.8 08/14/2017    TOTBILI 0.2 08/14/2017    PO4 4.4 08/14/2017      Coagulation:   Lab Results   Component Value Date    INR 1.0 08/14/2017         Anesthesia Plan    ASA score: 3   Plan: MAC  Induction method: intravenous  NPO status: acceptable      Informed Consent  Anesthetic plan and risks discussed with patient.  Use of blood products discussed with patient  Blood Consent: consented      Plan discussed with: anesthesiologist and CRNA.

## 2017-08-14 NOTE — Consults
Gynecology Consult  History and Physical Examination      Alyssa Cochran  Admission Date: 08/12/2017                     Assessment:   36 y.o. G15P2-0-13-2 admitted for seizure and headache, consult for abdominal pain. PMH significant for Crohn's Disease on prednisone, depression, anxiety, and recent hospitalization from 11/8-11/9 for possible seizure; left AMA.     Plan:  Pelvic exam     -GC/CT pending     -Wet prep wnl     -Pap smear pending  B-HCG <1  CT abd/pelvis wnl  GI following, unable to establish Crohn's diagnosis based on scope.  F/u with [facility] prn for gynecologic care    D/w Dr. Lavell Anchors, MD  PGY-1 Obstetrics and Gynecology    Please page the Gynecology pager at [phone] with any questions or concerns.  Thank you for allowing [place] to participate in the care of your patient.    ____________________________________________________________________________  Reason for Consult:  Abdominal pain    History of Present Illness:   Alyssa Cochran is a 36 y.o. G15P2-0-13-2 admitted for for seizure and headache, consult for chronic abdominal pain of unknown source. PMH significant for subjective history of Crohn's Disease on prednisone, depression, anxiety, and recent hospitalization from 11/8-11/9 for possible seizure; left AMA. Scope by GI on 11/24 unable to establish Crohn's disease or GI source for abdominal pain. Patient reports that she has had constant and diffuse 9/10 throbbing abdominal pain for two years, worse on right side. Pain associated with loose stools, N/V, and intermittent hematochezia. Patient denies any significant gynecologic history.     Review of Systems:  A comprehensive review of systems was performed and was negative, except as in HPI    OB History:   G15P2-0-13-2, SVD x2                     Gyn History:    LMP: 06/2017  Pap:  Positive for abnormal pap NOS, last pap ~3y ago  STD:  Negative  Menstrual Cycles:  Irregular for the past 2 years, likely 2/2 malnutrition Sexually active, last had intercourse ~61m ago. States that she has chronic abdominal pain with intercourse.     PmHx:   Past Medical History:   Diagnosis Date   ??? Back pain    ??? Heart disease    ??? Stomach pain        PsHx:   Past Surgical History:   Procedure Laterality Date   ??? COLON SURGERY     ??? TUBAL LIGATION         FmHx:   Family History   Problem Relation Age of Onset   ??? COPD Mother    ??? Arthritis Mother    ??? Heart Disease Father        Social:   Denies tobacco, EtOH, illicit drug use.      Allergies:    Patient has no known allergies.    Medications:  No current facility-administered medications on file prior to encounter.      Current Outpatient Prescriptions on File Prior to Encounter   Medication Sig Dispense Refill   ??? albuterol 0.083% (PROVENTIL; VENTOLIN) 2.5 mg /3 mL (0.083 %) nebulizer solution Inhale 3 mL solution by nebulizer as directed every 6 hours as needed for Wheezing or Shortness of Breath.     ??? brexpiprazole 1 mg tab Take 1  tablet by mouth daily.     ??? citalopram (CELEXA) 40 mg tablet Take 40 mg by mouth daily.     ??? fluticasone (FLONASE) 50 mcg/actuation nasal spray Apply 1 spray to each nostril as directed daily. Shake bottle gently before using.     ??? HYDROcodone/acetaminophen (NORCO) 5/325 mg tablet Take 1 tablet by mouth every 4 hours as needed for Pain 20 tablet 0   ??? ondansetron (ZOFRAN ODT) 4 mg rapid dissolve tablet Dissolve 4 mg by mouth every 8 hours as needed for Nausea or Vomiting. Place on tongue to disolve.     ??? pantoprazole DR (PROTONIX) 40 mg tablet Take 40 mg by mouth daily.     ??? prednisone (DELTASONE) 10 mg tablet Take three tablets by mouth daily with breakfast. 14 tablet 0   ??? prednisone (DELTASONE) 10 mg tablet Take two tablets by mouth daily with breakfast for 14 days. 14 tablet 0   ??? [START ON 08/29/2017] prednisone (DELTASONE) 10 mg tablet Take one tablet by mouth daily with breakfast for 14 days. 14 tablet 0 ??? [START ON 09/13/2017] prednisone (DELTASONE) 10 mg tablet Take one-half tablet by mouth daily with breakfast for 14 days. 7 tablet 0   ??? traMADol (ULTRAM) 50 mg tablet Take 1 tablet by mouth every 6 hours as needed for Pain. Indications: PAIN 20 tablet 0   ??? zolpidem (AMBIEN) 10 mg tablet Take 10 mg by mouth at bedtime as needed for Sleep.         Physical Exam:  Vitals:    08/14/17 0944 08/14/17 0945 08/14/17 1012 08/14/17 1143   BP:  144/82 (!) 145/91 (!) 142/108   Pulse: 68 64 59 73   Temp:   36.3 ???C (97.4 ???F) 36.8 ???C (98.3 ???F)   SpO2: 98% 96% 99% 99%   Weight:       Height:           Gen: NAD, malnourished appearance   CV: RRR, no appreciable murmurs  Chest: Unlabored, CTAB  Abd: diffusely tender to palpation w/ guarding, no rebound; non-distended, positive bowel sounds   Ext: No LE edema, nttp LE  GU: normal appearing external genitalia, no lesions or lacerations  SSE: normal vaginal mucosa, scant to no discharge, cervix appears normal without any masses or lesions  SVE: no CMT, abdomen diffusely tender to palpation     Lab/Radiology/Other Diagnostic Tests:    24-hour labs:    Results for orders placed or performed during the hospital encounter of 08/12/17 (from the past 24 hour(s))   CBC AND DIFF    Collection Time: 08/14/17  5:08 AM   Result Value Ref Range    White Blood Cells 15.6 (H) 4.5 - 11.0 K/UL    RBC 3.86 (L) 4.0 - 5.0 M/UL    Hemoglobin 9.4 (L) 12.0 - 15.0 GM/DL    Hematocrit 16.1 (L) 36 - 45 %    MCV 79.1 (L) 80 - 100 FL    MCH 24.4 (L) 26 - 34 PG    MCHC 30.9 (L) 32.0 - 36.0 G/DL    RDW 09.6 (H) 11 - 15 %    Platelet Count 555 (H) 150 - 400 K/UL    MPV 8.2 7 - 11 FL    Neutrophils 51 41 - 77 %    Lymphocytes 41 24 - 44 %    Monocytes 7 4 - 12 %    Eosinophils 0 0 - 5 %    Basophils 1 0 -  2 %    Absolute Neutrophil Count 8.00 (H) 1.8 - 7.0 K/UL    Absolute Lymph Count 6.40 (H) 1.0 - 4.8 K/UL    Absolute Monocyte Count 1.10 (H) 0 - 0.80 K/UL    Absolute Eosinophil Count 0.10 0 - 0.45 K/UL Absolute Basophil Count 0.10 0 - 0.20 K/UL   COMPREHENSIVE METABOLIC PANEL    Collection Time: 08/14/17  5:08 AM   Result Value Ref Range    Sodium 145 137 - 147 MMOL/L    Potassium 4.1 3.5 - 5.1 MMOL/L    Chloride 107 98 - 110 MMOL/L    Glucose 96 70 - 100 MG/DL    Blood Urea Nitrogen <2 (L) 7 - 25 MG/DL    Creatinine 1.61 0.4 - 1.00 MG/DL    Calcium 8.8 8.5 - 09.6 MG/DL    Total Protein 5.3 (L) 6.0 - 8.0 G/DL    Total Bilirubin 0.2 (L) 0.3 - 1.2 MG/DL    Albumin 2.9 (L) 3.5 - 5.0 G/DL    Alk Phosphatase 045 (H) 25 - 110 U/L    AST (SGOT) 24 7 - 40 U/L    CO2 32 (H) 21 - 30 MMOL/L    ALT (SGPT) 20 7 - 56 U/L    Anion Gap 6 3 - 12    eGFR Non African American >60 >60 mL/min    eGFR African American >60 >60 mL/min   MAGNESIUM    Collection Time: 08/14/17  5:08 AM   Result Value Ref Range    Magnesium 1.8 1.6 - 2.6 mg/dL   PHOSPHORUS    Collection Time: 08/14/17  5:08 AM   Result Value Ref Range    Phosphorus 4.4 2.0 - 4.5 MG/DL   RETICULOCYTE COUNT    Collection Time: 08/14/17  5:08 AM   Result Value Ref Range    Retic, Uncorrected 1.3 0.5 - 2.0 %    Retic, Corrected 0.9 %    Retic, Absolute 51.2 30 - 94 K/UL   PROTIME INR (PT)    Collection Time: 08/14/17  5:08 AM   Result Value Ref Range    INR 1.0 0.8 - 1.2   POC GLUCOSE    Collection Time: 08/14/17  6:53 AM   Result Value Ref Range    Glucose, POC 114 (H) 70 - 100 MG/DL       Point of Care Testing:  Glucose: 96 (08/14/17 0508)  POC Glucose (Download): (!) 114 (08/14/17 4098)

## 2017-08-14 NOTE — Other
EGD/Colonoscopy    Attending Physician: Erick Alley , MD   Indications: ??? ??? ??? ??? ??? ??? ??? ??? ???Generalized abdominal pain   Providers: ??? ??? ??? ??? ??? ??? ??? ??? ??? ???Erick Alley, MD (Doctor), Richard A. Joanne Gavel   ??? ??? ??? ??? ??? ??? ??? ??? ??? ??? ??? ??? ??? ??? ??? (Fellow), Erskine Squibb, RN (Nurse), Andrey Campanile   ??? ??? ??? ??? ??? ??? ??? ??? ??? ??? ??? ??? ??? ??? ??? Madu, Community education officer)   Referring Physician: ??? ??? ??? ??? ???Lesli Albee   Medications: ??? ??? ??? ??? ??? ??? ??? ??? ???Monitored Anesthesia Care   Complications: ??? ??? ??? ??? ??? ??? ??? ???No immediate complications.     EGD Findings:   ??? ??? ???Diffuse candidiasis was found in the upper third of the esophagus and in   ??? ??? ???the middle third of the esophagus. Cells for cytology were???obtained by   ??? ??? ???brushing.   ??? ??? ???The Z-line was regular.   ??? ??? ???The entire examined stomach was normal. Biopsies were taken with a cold   ??? ??? ???forceps for histology.   ??? ??? ???The examined duodenum was normal. Biopsies for histology were taken with   ??? ??? ???a cold forceps for evaluation of celiac disease.     Colonoscopy Findings:   ??? ??? ???The terminal ileum appeared normal.   ??? ??? ???A large amount of stool was found in the entire colon, interfering with   ??? ??? ???visualization. Lavage of the area was performed using copious amounts of   ??? ??? ???sterile water, resulting in incomplete clearance with fair visualization.   ??? ??? ???The visualized colonic mucosa showed no evidence of erythema,   ??? ??? ???ulcerations, or friability. Random biopsies of all segments were   ??? ??? ???obtained. Due to overlying solid stool at least 50% of the circumference   ??? ??? ???of the transverse and left colon could not be visualized and small or   ??? ??? ???flat lesions could have been missed.   ??? ??? ???Non-bleeding external hemorrhoids were found during retroflexion. The   ??? ??? ???hemorrhoids were medium-sized.     Recommendation: ??? ??? ??? ??? ??? ??? ??? - Continue present medications.   ??? ??? ??? ??? ??? ??? ??? ??? ??? ??? ??? ??? ??? ??? ??? - Await pathology results.   ??? ??? ??? ??? ??? ??? ??? ??? ??? ??? ??? ??? ??? ??? ??? - Consider initiating empiric treatment for ??? ??? ??? ??? ??? ??? ??? ??? ??? ??? ??? ??? ??? ??? ??? esophageal candidiasis with oral fluconazole   ??? ??? ??? ??? ??? ??? ??? ??? ??? ??? ??? ??? ??? ??? ??? for 14 days.                                -Repeat colonoscopy in 3 years for   ??? ??? ??? ??? ??? ??? ??? ??? ??? ??? ??? ??? ??? ??? ??? surveillance.   ??? ??? ??? ??? ??? ??? ??? ??? ??? ??? ??? ??? ??? ??? ??? - There was no evidence of active inflammatory   ??? ??? ??? ??? ??? ??? ??? ??? ??? ??? ??? ??? ??? ??? ??? bowel disease on the basis of this exam. We   ??? ??? ??? ??? ??? ??? ??? ??? ??? ??? ??? ??? ??? ??? ??? will followup biopsy results. An  outpatient   ??? ??? ??? ??? ??? ??? ??? ??? ??? ??? ??? ??? ??? ??? ??? patency capsule followed by small bowel capsule   ??? ??? ??? ??? ??? ??? ??? ??? ??? ?????? ??? ??? ??? ??? ???endoscopy can be considered for further   ??? ??? ??? ??? ??? ??? ??? ??? ??? ??? ??? ??? ??? ??? ??? evaluation.           -As there is no definitive evidence of inflammatory bowel                                 disease, start taper off of prednisone.

## 2017-08-14 NOTE — Patient Education
Medication Education    Alyssa Cochran accepted counseling and was engaged.  she verbalized understanding.    The following medications were discussed:  Lovenox, LR, Melatonin, Ondansetron, Oxycodone, Nulytely, Trazodone     Where indicated, the patient was provided with additional medication and/or disease-state information.  All patient questions were answered and patient acknowledged understanding of the medications, side effects and other pertinent medication information.    Follow up should occur as needed. RN educated pt on fall risk bundle indicators. Pt refused fall bundle. RN escalated to UC and RN updated M1 team. RN reeducated pt on fall bundle including high risk due to Hx of seizure.     Continue to address: indications    Norwood Levo, RN

## 2017-08-15 LAB — BENZODIAZEPINES-URINE RANDOM: Lab: NEGATIVE

## 2017-08-15 LAB — CANNABINOIDS-URINE RANDOM: Lab: NEGATIVE

## 2017-08-15 LAB — COCAINE-URINE RANDOM: Lab: NEGATIVE

## 2017-08-15 LAB — CULTURE-CSF W/SENSITIVITY

## 2017-08-15 LAB — OPIATES-URINE RANDOM: Lab: NEGATIVE

## 2017-08-15 LAB — AMPHETAMINES-URINE RANDOM: Lab: NEGATIVE

## 2017-08-15 LAB — BARBITURATES-URINE RANDOM: Lab: NEGATIVE

## 2017-08-15 LAB — PHENCYCLIDINES-URINE RANDOM: Lab: NEGATIVE

## 2017-08-15 MED ORDER — NICOTINE (POLACRILEX) 2 MG BU LOZG
2 mg | ORAL | 0 refills | Status: DC | PRN
Start: 2017-08-15 — End: 2017-08-17

## 2017-08-15 MED ORDER — MIRTAZAPINE 7.5 MG PO TAB
7.5 mg | Freq: Every evening | ORAL | 0 refills | Status: DC
Start: 2017-08-15 — End: 2017-08-17
  Administered 2017-08-16: 05:00:00 7.5 mg via ORAL

## 2017-08-15 MED ORDER — NICOTINE 7 MG/24 HR TD PT24
1 | Freq: Once | TRANSDERMAL | 0 refills | Status: DC
Start: 2017-08-15 — End: 2017-08-17
  Administered 2017-08-16: 05:00:00 1 via TRANSDERMAL

## 2017-08-15 MED ORDER — NICOTINE 14 MG/24 HR TD PT24
1 | Freq: Every day | TRANSDERMAL | 0 refills | Status: DC
Start: 2017-08-15 — End: 2017-08-17
  Administered 2017-08-15: 19:00:00 1 via TRANSDERMAL
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
## 2017-08-15 NOTE — Progress Notes
Pt requestign to go out and walk in the lobby. Pt requested pt walk around unit or on 6th floor d/t pt's hx of seizures. Pt requestign to go out and smoke say, "I feel really anxious and like I need a cigarette. M1 at bedside to discuss risks of going outside. Pt signed smoking wavier.

## 2017-08-15 NOTE — Progress Notes
Pt to take shower then RN will give am medications.    Pt educated about HFR , Pt stil refusing fall bundle. RN reeducated, pt verbalized education.

## 2017-08-15 NOTE — Patient Education
Medication Education    Alyssa Cochran accepted counseling and was engaged.  she verbalized understanding.    The following medications were discussed:  Lovenox, LR, Melatonin, Ondansetron, Oxycodone, Trazodone     Where indicated, the patient was provided with additional medication and/or disease-state information.  All patient questions were answered and patient acknowledged understanding of the medications, side effects and other pertinent medication information.    Follow up should occur as needed.    Continue to address: indications    Norwood Levo, RN

## 2017-08-15 NOTE — Progress Notes
General Progress Note    Name:  Alyssa Cochran   ZOXWR'U Date:  08/15/2017  Admission Date: 08/12/2017  LOS: 3 days                     Assessment/Plan:    Active Problems:    Abdominal pain    Seizure [facility])    35 y.o. female with PMH of Crohn's Disease, depression, anxiety, and recent hospitalization from 11/8-11/9 for possible seizure (left AMA) who presents with 2 more episodes of reported seizures, headache, abdominal pain, and nausea/vomiting.    ## Pain, Anxiety, Questionable malingering characteristics  -Pt repeatedly asking for IV pain medication and for Ambien at night to sleep.   -Pt has splitting on exam. She would talk completely fine and laugh during exam, and then becoming overly tearful when palpating her abdomen. Medical workup for her abdominal pain has been negative as above.  -Pt also repeatedly telling our team and nursing staff that she has been nauseous and had little appetite, but per the nursing note fromm yesterday appears to be eating and drinking fine.  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    -One of the episodes she calls her seizures was witnessed 11/23 and it appeared to more excruciating pain rather than an actual seizure activity. Responded well to 1 mg of ativan.    Plan:  -Consulted Psych 11/25  -Consulted Pain management   -Will need to assess if pt is safe at home and screen for other factors at home, appreciate psychs help with this.   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 One seizure associated with bladder incontinence.  - S/p EEG 07/30/2017: intermittent generalized theta slowing indicative of a mild encephalopathy.  No epileptiform activity or lateralizing signs seen.  - CT Head 07/29/2017: no acute intracranial hemorrhage or calvarial fracture  - S/p LP in ED 08/12/2017: WBC 2, RBC 1, glucose 59, protein 25 - appear to be normal findings   PLAN  > MRI brain no infact or epileptic focus  > Neuro seen in the ED and started on Keppra 500 mg BID and Pyridoxine   -Were present in room today for seizure episode, but pt remained conscious and was sitting in chair writhing in pain.   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 Recommended D/c Keppra.  > D/C tramadol   > Seizure Precautions  > Will hold off on Abx for possible meningitis as CSF findings not consistent with bacterial meningitis.    ## Abdominal Pain  ## Hx of Crohn's Disease  ## Nausea/Vomiting  ## Esophageal Candidiasis  - DDx for abdominal pain: Crohn's flare vs. gastroenteritis  - Originally seen by Dr. Phil Dopp at [facility] gastroenterology in [place]  - EGD and colonoscopy 2015- EGD without any abnormality. Colonoscopy showed scattered areas of mild erythematous mucosa the left side of the colon, small bowel biopsy negative  for celiac disease, rectosigmoid biopsies consistent with focal active colitis and other colonic biopsies consistent with melanosis coli    - Flex sigmoidoscopy 07/2015 by Dr. 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 Pathology showed findings consistent with crohn's disease as per pt   - CT Abd/Pelv 118/2018: Apparent wall thickening and enhancement of the terminal ileum as well as mild enhancement of the ascending colon, which may represent inflammatory enterocolitis given the history of Crohn's disease.  - Has been on 40 mg Prednisone qday for over a year   - Fecal leukocytes negative and C. Diff negative  - Amylase and lipase non-elevated   - EGD 08/14/2017: Esophageal candidiasis seen throughout the entire esophagus, likely in relation to patient's chronic prednisone use. Normal appearing stomach and duodenum. Biopsies taken for H. pylori and celiac rule out   - Colonoscopy 08/14/2017: Diffusely normal-appearing colon without evidence of erythema, friability, edema or signs of active colitis. Normal-appearing terminal ileum. Biopsies were taken randomly throughout the colon for evaluation of colitis   PLAN  > Will work on steroid taper - decrease prednisone to 35 mg qday today. Will decrease by 5 mg q week per GI recs  > GI consult, appreciate recs  > Will start empiric fluconazole for esophageal candidiasis - plan for 14-21 day course  > OBGYN consulted, appreciate recs   -Pelvic exam wnl  > Ordered spot urine porphyrins  > Plan to have patency capsule performed as an outpatient   > F/u biopsies from endoscopy procedures  > CT abdomen/pelvis showed resolution of inflammatory focus previously seen in the ileum. Showed fluid distension that was most likely physiologic.    -Considered Pelvic/Transvaginal US, but think pain is more due to somatization with no underlying pathology in her abdomen  > PRN anti-emetics  > PRN oxycodone for abdominal pain. Will avoid IV pain medications     ## Lactic Acidosis - reolved   - LA 2.9 on admit - likely from nausea/vomiting with poor oral intake  - Resolved, will continue to monitor    ## Hypokalemia - Resolved    #Severe Malnutrition  # Vitamin B6 Deficiency  - BMI 18.2  - Vitamin B6 level low at 3  PLAN  > Consult dietary and placed calorie count  > Providing supplementary vitamin B6    Fluids, electrolytes and Nutrition: IVF: None  Electrolytes: replacing prn  Diet: Diet Regular    Prophylaxis:  DVT: Lovenox    Code status: Full Code  Disposition: Continued admit to Med-1.    Patient seen and discussed with Dr. Pauline Good, MD  Internal Medicine PGY-1    Pager [phone]  ________________________________________________________________________    Subjective  Alyssa Cochran is a 36 y.o. female.  Alyssa Cochran had no acute events overnight. Per nursing note she has been eating and drinking with a voracious appetite. Please see RN note for more details. She denies any fevers or chills. States that ehr abdominal pain has been much better controlled and smiles while she is talking to me. Upon exam she becomes extremely tearful during abdominal exam and again starts crying when talking about the pain. She is also repeatedly requesting Ambien and IV pain medications.    Medications  Scheduled Meds:    citalopram (CELEXA) tablet 40 mg 40 mg  Oral QDAY   electrolyte GUT PEG (NULYTELY, COLYTE, GAVILYTE-N) oral solution 4 L 4 L Oral As Prescribed   enoxaparin (LOVENOX) syringe 40 mg 40 mg Subcutaneous QDAY(21)   fluconazole (DIFLUCAN) tablet 200 mg 200 mg Oral QDAY   influenza (=>6 MO)(QUADrivalent) (FLULAVAL) 60 mcg (15 mcg x 4)/0.5 mL 2018-19 PF syringe 0.5 mL 0.5 mL Intramuscular ONCE   melatonin tablet 5 mg 5 mg Oral QHS   pantoprazole DR (PROTONIX) tablet 40 mg 40 mg Oral QDAY   prednisone (DELTASONE) tablet 35 mg 35 mg Oral QDAY   pyridoxine (VITAMIN B-6) tablet 50 mg 50 mg Oral QDAY   senna/docusate (SENOKOT-S) tablet 1 tablet 1 tablet Oral BID   Continuous Infusions:  ??? lactated ringers infusion 1,000 mL (08/14/17 0708)   ??? lactated ringers infusion 75 mL/hr at 08/13/17 1131   ??? sodium chloride 0.9 %   infusion       PRN and Respiratory Meds:electrolyte GUT PEG PRN (On Call from Rx), ondansetron (ZOFRAN) IV Q6H PRN, oxyCODONE Q3H PRN, prochlorperazine Q6H PRN, traZODone QHS PRN    Objective: Vital Signs: Last Filed                 Vital Signs: 24 Hour Range   BP: 142/82 (11/25 0750)  Temp: 36.8 ???C (98.2 ???F) (11/25 0750)  Pulse: 72 (11/25 0750)  Respirations: 16 PER MINUTE (11/25 0750)  SpO2: 99 % (11/25 0750)  O2 Delivery: None (Room Air) (11/25 0750) BP: (116-145)/(63-108)   Temp:  [36.3 ???C (97.4 ???F)-37 ???C (98.6 ???F)]   Pulse:  [59-73]   Respirations:  [15 PER MINUTE-16 PER MINUTE]   SpO2:  [96 %-100 %]   O2 Delivery: None (Room Air)   Intensity Pain Scale (Self Report): 8 (08/15/17 1610) Vitals:    08/12/17 1202 08/12/17 1600   Weight: 43.1 kg (95 lb) 46.6 kg (102 lb 11.8 oz)       Intake/Output Summary:  (Last 24 hours)    Intake/Output Summary (Last 24 hours) at 08/15/17 1002  Last data filed at 08/15/17 0800   Gross per 24 hour   Intake             1324 ml   Output              800 ml   Net              524 ml      Stool Occurrence: 1    Physical Exam  General appearance: fatigued, poor nutritional state, uncooperative, mild distress, cachectic. Patient very tearful  Neurologic: Grossly normal  Lungs: clear to auscultation bilaterally  Heart: regular rate and rhythm  Abdomen: abnormal findings:  diffusely tender to palpation throughout her abdomen with some guarding. No rigidity noted. PT becomes extremely tearful during abdominal exam.   Extremities: extremities normal, atraumatic, no cyanosis or edema    Lab Review  Pertinent labs reviewed    Point of Care Testing  (Last 24 hours)    Radiology and other Diagnostics Review:    Pertinent radiology reviewed.    Ernest Haber, MD  Internal Medicine PGY-1    Pager [phone]

## 2017-08-16 ENCOUNTER — Encounter: Admit: 2017-08-16 | Discharge: 2017-08-16 | Payer: BC Managed Care – HMO | Primary: Adult Health

## 2017-08-16 ENCOUNTER — Inpatient Hospital Stay: Admit: 2017-08-13 | Discharge: 2017-08-13 | Payer: BC Managed Care – HMO | Primary: Adult Health

## 2017-08-16 ENCOUNTER — Inpatient Hospital Stay: Admit: 2017-08-12 | Discharge: 2017-08-12 | Payer: BC Managed Care – HMO | Primary: Adult Health

## 2017-08-16 DIAGNOSIS — F445 Conversion disorder with seizures or convulsions: Principal | ICD-10-CM

## 2017-08-16 DIAGNOSIS — F17210 Nicotine dependence, cigarettes, uncomplicated: ICD-10-CM

## 2017-08-16 DIAGNOSIS — F411 Generalized anxiety disorder: ICD-10-CM

## 2017-08-16 DIAGNOSIS — E531 Pyridoxine deficiency: ICD-10-CM

## 2017-08-16 DIAGNOSIS — F609 Personality disorder, unspecified: ICD-10-CM

## 2017-08-16 DIAGNOSIS — E872 Acidosis: ICD-10-CM

## 2017-08-16 DIAGNOSIS — K5010 Crohn's disease of large intestine without complications: ICD-10-CM

## 2017-08-16 DIAGNOSIS — R109 Unspecified abdominal pain: ICD-10-CM

## 2017-08-16 DIAGNOSIS — F410 Panic disorder [episodic paroxysmal anxiety] without agoraphobia: ICD-10-CM

## 2017-08-16 DIAGNOSIS — Z7952 Long term (current) use of systemic steroids: ICD-10-CM

## 2017-08-16 DIAGNOSIS — D509 Iron deficiency anemia, unspecified: ICD-10-CM

## 2017-08-16 DIAGNOSIS — K644 Residual hemorrhoidal skin tags: ICD-10-CM

## 2017-08-16 DIAGNOSIS — E876 Hypokalemia: ICD-10-CM

## 2017-08-16 DIAGNOSIS — G629 Polyneuropathy, unspecified: ICD-10-CM

## 2017-08-16 DIAGNOSIS — G8929 Other chronic pain: ICD-10-CM

## 2017-08-16 DIAGNOSIS — Z9049 Acquired absence of other specified parts of digestive tract: ICD-10-CM

## 2017-08-16 DIAGNOSIS — B3781 Candidal esophagitis: ICD-10-CM

## 2017-08-16 DIAGNOSIS — F331 Major depressive disorder, recurrent, moderate: ICD-10-CM

## 2017-08-16 DIAGNOSIS — K6389 Other specified diseases of intestine: ICD-10-CM

## 2017-08-16 DIAGNOSIS — Z681 Body mass index (BMI) 19 or less, adult: ICD-10-CM

## 2017-08-16 DIAGNOSIS — E860 Dehydration: ICD-10-CM

## 2017-08-16 DIAGNOSIS — Z62810 Personal history of physical and sexual abuse in childhood: ICD-10-CM

## 2017-08-16 LAB — MISC REFERENCE TEST

## 2017-08-16 LAB — LEAD LEVEL: Lab: 1.8 mg/dL (ref 8.5–10.6)

## 2017-08-16 LAB — PERIPHERAL SMEAR

## 2017-08-16 LAB — COMPREHENSIVE METABOLIC PANEL: Lab: 140 MMOL/L — ABNORMAL LOW (ref 137–147)

## 2017-08-16 LAB — MAGNESIUM: Lab: 1.8 mg/dL — ABNORMAL LOW (ref >60)

## 2017-08-16 LAB — CBC AND DIFF: Lab: 17 K/UL — ABNORMAL HIGH (ref 4.5–11.0)

## 2017-08-16 LAB — CULTURE-FECES W/SENSITIVITY

## 2017-08-16 LAB — CHLAM/NG PCR SWAB

## 2017-08-16 LAB — PHOSPHORUS: Lab: 4.4 mg/dL — ABNORMAL LOW (ref 2.0–4.5)

## 2017-08-16 MED ORDER — PYRIDOXINE (VITAMIN B6) 50 MG PO TAB
50 mg | Freq: Every day | ORAL | 0 refills | Status: AC
Start: 2017-08-16 — End: ?

## 2017-08-16 MED ORDER — FLUCONAZOLE 200 MG PO TAB
200 mg | ORAL_TABLET | Freq: Every day | ORAL | 0 refills | 3.00000 days | Status: AC
Start: 2017-08-16 — End: ?
  Filled 2017-08-16 (×2): qty 11, 11d supply, fill #1

## 2017-08-16 MED ORDER — POTASSIUM CHLORIDE IN WATER 10 MEQ/50 ML IV PGBK
10 meq | INTRAVENOUS | 0 refills | Status: CP
Start: 2017-08-16 — End: ?
  Administered 2017-08-16 (×4): 10 meq via INTRAVENOUS

## 2017-08-16 MED ORDER — MIRTAZAPINE 7.5 MG PO TAB
7.5 mg | ORAL_TABLET | Freq: Every evening | ORAL | 1 refills | Status: AC
Start: 2017-08-16 — End: ?
  Filled 2017-08-16 (×2): qty 30, 30d supply, fill #1

## 2017-08-16 MED ORDER — PREDNISONE 5 MG PO TAB
ORAL_TABLET | Freq: Every day | 0 refills | Status: AC
Start: 2017-08-16 — End: ?

## 2017-08-16 MED ORDER — POTASSIUM CHLORIDE 20 MEQ PO TBTQ
60 meq | Freq: Once | ORAL | 0 refills | Status: CP
Start: 2017-08-16 — End: ?
  Administered 2017-08-16: 15:00:00 60 meq via ORAL

## 2017-08-16 MED ORDER — TRAZODONE 50 MG PO TAB
50 mg | ORAL_TABLET | Freq: Every evening | ORAL | 1 refills | Status: AC | PRN
Start: 2017-08-16 — End: ?
  Filled 2017-08-16 (×2): qty 30, 30d supply, fill #1

## 2017-08-16 MED ORDER — OXYCODONE 5 MG PO TAB
5-10 mg | ORAL_TABLET | ORAL | 0 refills | 6.00000 days | Status: AC | PRN
Start: 2017-08-16 — End: 2017-09-07
  Filled 2017-08-16 (×2): qty 40, 5d supply, fill #1

## 2017-08-16 MED ADMIN — SODIUM CHLORIDE 0.9 % IV SOLP [27838]: 1000 mL | INTRAVENOUS | @ 15:00:00 | Stop: 2017-08-16 | NDC 00338004904

## 2017-08-16 NOTE — Consults
Nursing Pain Management - Initial Consult         Pt. Name: Alyssa Cochran  Admit Date: 08/12/2017  Room: ZO1096/04         Reason for consult, complex pain patient, d/c planning, d/c recommendations    Communication: Discussed patient and consult reason with Dr.David Jonnie Finner (primary team) prior to interview with patient. Communicated with bedside nurse.     Suggestions to Consider:  Primary team is responsible for entering orders.     ??? Amitriptyline 25 mg PO q HS - TCAs can help with neuropathic pain pathways associated with GI pain stimuli. An added benefit of this medication is sometimes improving sleep hygiene, thus taken at bedtime. Patient shares with me that she has recently been taken off of Zolpidem making sleeping difficult. Prednisone likely adding to sleep dysfunction as well.   ??? If not discharging today, suggest tapering oxycodone prior to discharge to ensure safe discharge planning. Currently patient has been receiving 50-70 mg of oxycodone daily making her at risk for opioid withdrawal with sudden cessation. Suggest changing to every 4 hours as needed for pain.   ??? If discharging later today (may need to wait on ride) would suggest tapering for the rest of the day until discharge, oxycodone dose is at every 3 hours as needed for pain.   ??? A-K pad to pain not to exceed 20 minutes on and at least 20 minutes off, patient not to sleep with heating pad on, already using some.  ??? Patient employing frequent ambulation as an intervention for pain management.     Anticipated D/C Planning:   Non-pharmacological interventions. ???  Multi-modal oral pain regimen.    Patient wanting to establish with [facility].   She can be referred to Persistent pain management clinic but as of last check this is a many month waiting list before being seen.     She has been given information of [facility], for Behavioral Pain Management in the past. I again spoke to her of Dr. Val Eagle' service and how he can help and encouraged her to look at his website and maybe even call before discounting resource.     I provided her with verbal instructions of how to call her insurance carrier and ask for their approved pain management specialists. From that list ensure that they are not an interventionist pain management and that they will follow her monthly. She expressed understanding.      Patient given Gaylyn Rong, patient education, Comfort Menu and shared with her free download apps as well how distraction, meditation, guided imagery, relaxation breathing can help her manage pain without the side effects of systemic medications.     If ordered today, amitriptyline 25 mg PO q HS, month supply?    In the interest of safe discharge planning.   If patient is to discharge today she will need oxycodone on discharge to help refrain from opioid withdrawal symptoms. Would suggest discharging on oxycodone 10 mg every 6 hours and allowing enough for about 3-4 days.    Summary:     Alyssa Cochran is a 36 y.o. female with PMH of Crohn's Disease, depression, anxiety, and recent hospitalization from 11/8-11/9 for possible seizure (left AMA) who presents with 2 more episodes of reported seizures, headache, abdominal pain, and nausea/vomiting.     Very pleasant conversation lasting ~ 20 minutes in which we discussed her pain, GI pain management strategies and goals as well as the complications of  daily opioid administration, and safe discharge planning. We discussed her health care resources and how to improve them. We discussed resources [place] has and what can be offered. We discussed CBT with [facility] eduction Comfort Menu being provided to her as well as talking about [facility]. All of her questions were answered or addressed.     Thank you for allowing our service to participate in the care of this patient. Please page with questions/concerns, team pager [phone].    Larrie Kass, MSN, RN-BC  Clinical Nurse Coordinator Pain Management  [phone]           Current Medication Regimen:     Current Facility-Administered Medications:   ???  citalopram (CELEXA) tablet 40 mg, 40 mg, Oral, QDAY, Roller, John, MD, 40 mg at 08/16/17 0831  ???  electrolyte GUT PEG (NULYTELY, COLYTE, GAVILYTE-N) oral solution 2 L, 2 L, Oral, PRN (On Call from Rx), Joanne Gavel, Richard A., DO  ???  electrolyte GUT PEG (NULYTELY, COLYTE, GAVILYTE-N) oral solution 4 L, 4 L, Oral, As Prescribed, Joanne Gavel, Richard A., DO  ???  enoxaparin (LOVENOX) syringe 40 mg, 40 mg, Subcutaneous, QDAY(21), Roller, John, MD, 40 mg at 08/14/17 2123  ???  fluconazole (DIFLUCAN) tablet 200 mg, 200 mg, Oral, QDAY, Gillian Shields, MD, 200 mg at 08/16/17 0831  ???  influenza (=>6 MO)(QUADrivalent) (FLULAVAL) 60 mcg (15 mcg x 4)/0.5 mL 2018-19 PF syringe 0.5 mL, 0.5 mL, Intramuscular, ONCE, Lesli Albee, MD  ???  melatonin tablet 5 mg, 5 mg, Oral, QHS, Richarda Osmond., MD, 5 mg at 08/15/17 2253  ???  mirtazapine (REMERON) tablet 7.5 mg, 7.5 mg, Oral, QHS, Willis, Brayden, DO, 7.5 mg at 08/15/17 2253  ???  nicotine (NICODERM CQ STEP 2) 14 mg/day patch 1 patch, 1 patch, Transdermal, QDAY, Richarda Osmond., MD, Stopped at 08/16/17 [phone]  ???  nicotine (NICODERM CQ STEP 3) 7 mg/day patch 1 patch, 1 patch, Transdermal, ONCE, Gillian Shields, MD, 1 patch at 08/15/17 2253  ???  nicotine polacrilex (COMMIT; NICORETTE) lozenge 2 mg, 2 mg, Oral, Q1H PRN, Willis, Brayden, DO  ???  ondansetron (ZOFRAN) injection 4 mg, 4 mg, Intravenous, Q6H PRN, Liborio Nixon, Sammy, MD, 4 mg at 08/13/17 2021  ???  oxyCODONE (ROXICODONE, OXY-IR) tablet 5-10 mg, 5-10 mg, Oral, Q3H PRN, Richarda Osmond., MD, 10 mg at 08/16/17 0851  ???  pantoprazole DR (PROTONIX) tablet 40 mg, 40 mg, Oral, QDAY, Roller, John, MD, 40 mg at 08/16/17 0830  ???  potassium chloride in water IVPB 10 mEq, 10 mEq, Intravenous, Q1H X 4DO, Subramanian, Praveen, DO, Last Rate: 50 mL/hr at 08/16/17 0940, 10 mEq at 08/16/17 0940 ???  prednisone (DELTASONE) tablet 35 mg, 35 mg, Oral, QDAY, Roller, John, MD, 35 mg at 08/16/17 0831  ???  prochlorperazine maleate (COMPAZINE) tablet 10 mg, 10 mg, Oral, Q6H PRN, Lesli Albee, MD  ???  pyridoxine (VITAMIN B-6) tablet 50 mg, 50 mg, Oral, QDAY, Tayiem, Sammy, MD, 50 mg at 08/16/17 0831  ???  senna/docusate (SENOKOT-S) tablet 1 tablet, 1 tablet, Oral, BID, Lesli Albee, MD, 1 tablet at 08/16/17 0831  ???  traZODone (DESYREL) tablet 50 mg, 50 mg, Oral, QHS PRN, Sharl Ma, MD, 50 mg at 08/15/17 2252     Prior to Admission Analgesic Regimen:  Per patient had not been taking tramadol since the end of October.  No other analgesics.            Assessment:    (W)  Words to describe  pain:  throbbing  (I)  Intensity of pain:  7              Patient's personal pain goal:  tolerable  (L) Location of pain:  Right quadrant that at times wraps to her right flank  (D)  Duration of pain:  constant  (A)  Aggravating/Alleviating factors:  I don't notice it going away / what they are doing here seems to help    Last Bowel Movement: 08/14/2017    Opioid Calculations:     Date: PTA    Zero    Total oral morphine equivalent ~  Date: 08/15/2017    Oxycodone 70 mg      Total oral morphine equivalent ~ 70 mg       Vitals:    08/15/17 1225 08/15/17 1909 08/15/17 2332 08/16/17 0744   BP: 137/81 (!) 140/96 (!) 150/93 (!) 155/91   Pulse: 94 69 66 84   Temp: 36.9 ???C (98.4 ???F) 36.9 ???C (98.5 ???F) 36.7 ???C (98.1 ???F) 36.6 ???C (97.8 ???F)   SpO2: 99% 96% 99% 99%   Weight:       Height:         No Known Allergies      Basic Metabolic Profile    Lab Results   Component Value Date/Time    NA 140 08/16/2017 06:17 AM    K 3.0 (L) 08/16/2017 06:17 AM    CL 99 08/16/2017 06:17 AM    CO2 33 (H) 08/16/2017 06:17 AM    GAP 8 08/16/2017 06:17 AM    Lab Results   Component Value Date/Time    BUN 4 (L) 08/16/2017 06:17 AM    CR 0.56 08/16/2017 06:17 AM    GLU 100 08/16/2017 06:17 AM      `Hemogram   Lab Results   Component Value Date/Time WBC 17.4 (H) 08/16/2017 06:17 AM    RBC 4.09 08/16/2017 06:17 AM    HGB 9.7 (L) 08/16/2017 06:17 AM    HCT 32.4 (L) 08/16/2017 06:17 AM    MCV 79.3 (L) 08/16/2017 06:17 AM    MCH 23.7 (L) 08/16/2017 06:17 AM    MCHC 29.9 (L) 08/16/2017 06:17 AM    RDW 20.0 (H) 08/16/2017 06:17 AM    PLTCT 591 (H) 08/16/2017 06:17 AM    MPV 8.1 08/16/2017 06:17 AM

## 2017-08-16 NOTE — Patient Education
Medication Education    Maliyani Hochstetler accepted counseling and was receptive.  she verbalized understanding.    The following medications were discussed:    Celexa  Diflucan  Oxycodone  Protonix  Deltasone  Senna    Where indicated, the patient was provided with additional medication and/or disease-state information.  All patient questions were answered and patient acknowledged understanding of the medications, side effects and other pertinent medication information.    Follow up should occur daily.    Continue to address: indications    Dorathy Kinsman, RN

## 2017-08-16 NOTE — Progress Notes
Pt currently has a smoking waiver. Pt educated on the risks of refusing the high fall bundle.

## 2017-08-16 NOTE — Progress Notes
Patient was seen and examined at bedside, she denies any ongoing abdominal pain, denies any nausea/vomiting, has been able to tolerate p.o. intake well.    Recommendations:  - Obtain records containing endoscopy reports and operative reports from [facility] and [place]  - Patency capsule was given to the patient at 11 AM today, she will need KUB 30 hours after, this will be tomorrow 5 PM  - We will arrange for follow-up appointment with Dr. Janene Madeira  - Follow-up with pathology reports  - Continue fluconazole for full course of 14 days  - she may need EUS as an outpatient for further evaluation for possible chronic pancreatitis as cause of her abd pain    Patient was discussed with attending on service, Dr. Welton Flakes.    Renea Ee, MD  Gastroenterology & Hepatology Fellow  Pager [phone]

## 2017-08-16 NOTE — Progress Notes
Alyssa Cochran discharged on 08/16/2017.   Marland Kitchen  Discharge instructions reviewed with patient.  Valuables returned:    .  Home medications:    .  Functional assessment at discharge complete: Yes .

## 2017-08-16 NOTE — Discharge Instructions - Appointments
Follow up with GI in one week for capsule endoscopy.  Will also need to schedule potential endoscopic ultrasound of pancrease.  Follow up with pain management.  Follow up with PCP in one week.  It is important that you quit taking opioids for pain relief as this has potential for poor outcomes in the setting of Crohn's disease.  Obtain abdominal x-ray on August 17, 2017 at approximately 6 PM.  Complete steroid taper.

## 2017-08-16 NOTE — Patient Education
Medication Education    Camrin Holle accepted counseling and was receptive.  she verbalized understanding.    The following medications were discussed:  Oxycodone  Diflucan  Nicotine patch  protonix      Where indicated, the patient was provided with additional medication and/or disease-state information.  All patient questions were answered and patient acknowledged understanding of the medications, side effects and other pertinent medication information.    Follow up should occur as needed.    Continue to address: certain medications    Mariane Duval, RN

## 2017-08-16 NOTE — Progress Notes
General Progress Note    Name:  Alyssa Cochran   ZOXWR'[id number] Date:  08/16/2017  Admission Date: 08/12/2017  LOS: 4 days                     Assessment/Plan:    Active Problems:    Abdominal pain    Underweight    Candidiasis of esophagus (HCC)    GAD (generalized anxiety disorder)    36 y.o. female with PMH of Crohn's Disease, depression, anxiety, and recent hospitalization from 11/8-11/9 for possible seizure (left AMA) who presents with 2 more episodes of reported seizures, headache, abdominal pain, and nausea/vomiting.    ## Pain, Anxiety, Questionable malingering characteristics  -Pt repeatedly asking for IV pain medication and for Ambien at night to sleep.   -Pt has splitting on exam. She would talk completely fine and laugh during exam, and then becoming overly tearful when palpating her abdomen. Medical workup for her abdominal pain has been negative as above.  -Pt also repeatedly telling our team and nursing staff that she has been nauseous and had little appetite, but per the nursing note fromm yesterday appears to be eating and drinking fine.  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    -One of the episodes she calls her seizures was witnessed 11/23 and it appeared to more excruciating pain rather than an actual seizure activity. Responded well to 1 mg of ativan.    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 One seizure associated with bladder incontinence.  - S/p EEG 07/30/2017: intermittent generalized theta slowing indicative of a mild encephalopathy.  No epileptiform activity or lateralizing signs seen.  - CT Head 07/29/2017: no acute intracranial hemorrhage or calvarial fracture  - S/p LP in ED 08/12/2017: WBC 2, RBC 1, glucose 59, protein 25 - appear to be normal findings   PLAN   > MRI brain no infact or epileptic focus   > Neuro seen in the ED and started on Keppra 500 mg BID and Pyridoxine    -Were present in room today for seizure episode, but pt remained conscious and was sitting in chair writhing in pain.    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   >Recommended D/c Keppra.   > D/C tramadol    > Seizure Precautions    ## Abdominal Pain  ## Hx of Crohn's Disease  ## Nausea/Vomiting  ## Esophageal Candidiasis  - DDx for abdominal pain: Crohn's flare vs. gastroenteritis  - Originally seen by Dr. Phil Dopp at [facility] gastroenterology in [place]  - EGD and colonoscopy 2015- EGD without any abnormality. Colonoscopy showed scattered areas of mild erythematous mucosa the left side of  the colon, small bowel biopsy negative for celiac disease, rectosigmoid biopsies consistent with focal active colitis and other colonic biopsies consistent with melanosis coli    - Flex sigmoidoscopy 07/2015 by Dr. Phil Dopp - erythematous mucosa along the rectosigmoid with freability and biopsy consistent with chronic inflammation   - H/o of small bowel resection (10 inches) on 12/2015 at [facility], [place] for intestinal obstruction due to intussception. Pathology showed findings consistent with crohn's disease as per pt   - CT Abd/Pelv 118/2018: Apparent wall thickening and enhancement of the terminal ileum as well as mild enhancement of the ascending colon, which may represent inflammatory enterocolitis given the history of Crohn's disease.  - Has been on 40 mg Prednisone qday for over a year   - Fecal leukocytes negative and C. Diff negative  - Amylase and lipase non-elevated   - EGD 08/14/2017: Esophageal candidiasis seen throughout the entire esophagus, likely in relation to patient's chronic prednisone use. Normal appearing stomach and duodenum. Biopsies taken for H. pylori and celiac rule out   - Colonoscopy 08/14/2017: Diffusely normal-appearing colon without evidence of erythema, friability, edema or signs of active colitis. Normal-appearing terminal ileum. Biopsies were taken randomly throughout the colon for evaluation of colitis   PLAN   > Will work on steroid taper - decrease prednisone to 35 mg qday today. Will decrease by 5 mg q week per GI recs   > GI consult, appreciate recs    > Patency capsule, follow-up KUB 11/27.  GI    > Will perform capsule endoscopy outpatient    > Considering an endoscopic ultrasound to assess for chronic pancreatitis   > Will start empiric fluconazole for esophageal candidiasis - plan for 14-21 day course   > OBGYN consulted, appreciate recs    -Pelvic exam wnl   > Ordered spot urine porphyrins   > PRN anti-emetics   > d/c with 5 day supply of oxycodone    >discussed opioid use in pts with IBD with pt     ## Lactic Acidosis - reolved   - LA 2.9 on admit - likely from nausea/vomiting with poor oral intake  - Resolved, will continue to monitor    ## Hypokalemia - Resolved    #Severe Malnutrition  # Vitamin B6 Deficiency  - BMI 18.2  - Vitamin B6 level low at 3  PLAN   > Consult dietary and placed calorie count   > Providing supplementary vitamin B6    Fluids, electrolytes and Nutrition:  IVF: None  Electrolytes: replacing prn  Diet: Diet Regular    Prophylaxis:  DVT: Lovenox    Code status: Full Code  Disposition:  Discharge today Patient seen and discussed with Dr. Caryl Ada DO  Internal Medicine PGY 1  Pager [phone]  ________________________________________________________________________    Subjective  Alyssa Cochran is a 36 y.o. female.  Patient seen and examined.  No acute events overnight.  Prior to my examination she was blow drying her hair comfortably.  She endorses 8 out of 10 throbbing abdominal pain in the right upper quadrant which is improved with opioid.  Denies any headache, fever, chills, nausea, vomiting, diarrhea, constipation.  In fact she states that this is the best she is felt in over 1 month.  Would like to go home today.    Medications  Scheduled Meds:    citalopram (CELEXA) tablet 40 mg 40 mg Oral QDAY   electrolyte GUT PEG (NULYTELY, COLYTE, GAVILYTE-N) oral solution  4 L 4 L Oral As Prescribed   enoxaparin (LOVENOX) syringe 40 mg 40 mg Subcutaneous QDAY(21)   fluconazole (DIFLUCAN) tablet 200 mg 200 mg Oral QDAY   influenza (=>6 MO)(QUADrivalent) (FLULAVAL) 60 mcg (15 mcg x 4)/0.5 mL 2018-19 PF syringe 0.5 mL 0.5 mL Intramuscular ONCE   melatonin tablet 5 mg 5 mg Oral QHS   mirtazapine (REMERON) tablet 7.5 mg 7.5 mg Oral QHS   nicotine (NICODERM CQ STEP 2) 14 mg/day patch 1 patch 1 patch Transdermal QDAY   nicotine (NICODERM CQ STEP 3) 7 mg/day patch 1 patch 1 patch Transdermal ONCE   pantoprazole DR (PROTONIX) tablet 40 mg 40 mg Oral QDAY   prednisone (DELTASONE) tablet 35 mg 35 mg Oral QDAY   pyridoxine (VITAMIN B-6) tablet 50 mg 50 mg Oral QDAY   senna/docusate (SENOKOT-S) tablet 1 tablet 1 tablet Oral BID   Continuous Infusions:    PRN and Respiratory Meds:electrolyte GUT PEG PRN (On Call from Rx), nicotine polacrilex Q1H PRN, ondansetron (ZOFRAN) IV Q6H PRN, oxyCODONE Q3H PRN, prochlorperazine Q6H PRN, traZODone QHS PRN    Objective:                          Vital Signs: Last Filed                 Vital Signs: 24 Hour Range   BP: 126/69 (11/26 1533)  Temp: 36.8 ???C (98.2 ???F) (11/26 1533) Pulse: 66 (11/26 1533)  Respirations: 16 PER MINUTE (11/26 1533)  SpO2: 100 % (11/26 1533)  O2 Delivery: None (Room Air) (11/26 1533) BP: (126-155)/(69-100)   Temp:  [36.6 ???C (97.8 ???F)-36.9 ???C (98.5 ???F)]   Pulse:  [66-84]   Respirations:  [16 PER MINUTE]   SpO2:  [96 %-100 %]   O2 Delivery: None (Room Air)   Intensity Pain Scale (Self Report): 8 (08/16/17 1245) Vitals:    08/12/17 1202 08/12/17 1600   Weight: 43.1 kg (95 lb) 46.6 kg (102 lb 11.8 oz)       Intake/Output Summary:  (Last 24 hours)    Intake/Output Summary (Last 24 hours) at 08/16/17 1613  Last data filed at 08/16/17 1534   Gross per 24 hour   Intake              222 ml   Output                0 ml   Net              222 ml      Stool Occurrence: 1    Physical Exam  General appearance: Cachectic, no acute distress, comfortable  HEENT: No conjunctival injection, anicteric sclera extraocular motions intact bilaterally  Lungs: clear to auscultation bilaterally  Heart: regular rate and rhythm  Abdomen:, Tender diffusely, soft, worse in her right upper quadrant bowel sounds present in all 4 quadrants, no rebound tenderness, no guarding  Extremities: extremities normal, atraumatic, no cyanosis or edema   Neuro: Cranial nerves II through XII grossly    Lab Review  Pertinent labs reviewed    Point of Care Testing  (Last 24 hours)  Glucose: 100 (08/16/17 0617)  Radiology and other Diagnostics Review:    Pertinent radiology reviewed.    Ricard Dillon DO  Internal Medicine PGY 1  Pager [phone]

## 2017-08-16 NOTE — Progress Notes
Pt walking around the hospital

## 2017-08-17 ENCOUNTER — Encounter: Admit: 2017-08-17 | Discharge: 2017-08-17 | Payer: BC Managed Care – HMO | Primary: Adult Health

## 2017-08-17 ENCOUNTER — Ambulatory Visit: Admit: 2017-08-17 | Discharge: 2017-08-17 | Payer: BC Managed Care – HMO | Primary: Adult Health

## 2017-08-17 DIAGNOSIS — R109 Unspecified abdominal pain: Principal | ICD-10-CM

## 2017-08-17 LAB — HIV 1& 2 AG-AB SCRN W REFLEX HIV 1 PCR QUANT: Lab: NEGATIVE

## 2017-08-19 ENCOUNTER — Encounter: Admit: 2017-08-19 | Discharge: 2017-08-19 | Payer: BC Managed Care – HMO | Primary: Adult Health

## 2017-08-19 DIAGNOSIS — M549 Dorsalgia, unspecified: ICD-10-CM

## 2017-08-19 DIAGNOSIS — I519 Heart disease, unspecified: Principal | ICD-10-CM

## 2017-08-19 DIAGNOSIS — R109 Unspecified abdominal pain: ICD-10-CM

## 2017-08-20 ENCOUNTER — Encounter: Admit: 2017-08-20 | Discharge: 2017-08-20 | Payer: BC Managed Care – HMO | Primary: Adult Health

## 2017-08-28 ENCOUNTER — Encounter: Admit: 2017-08-28 | Discharge: 2017-08-28 | Payer: BC Managed Care – HMO | Primary: Adult Health

## 2017-08-30 LAB — CULTURE-FUNGAL,CSF

## 2017-09-07 ENCOUNTER — Encounter: Admit: 2017-09-07 | Discharge: 2017-09-07 | Payer: BC Managed Care – HMO | Primary: Adult Health

## 2017-09-07 ENCOUNTER — Ambulatory Visit: Admit: 2017-09-07 | Discharge: 2017-09-07 | Payer: BC Managed Care – HMO | Primary: Adult Health

## 2017-09-07 DIAGNOSIS — I519 Heart disease, unspecified: Principal | ICD-10-CM

## 2017-09-07 DIAGNOSIS — R1084 Generalized abdominal pain: Principal | ICD-10-CM

## 2017-09-07 DIAGNOSIS — R109 Unspecified abdominal pain: ICD-10-CM

## 2017-09-07 DIAGNOSIS — M549 Dorsalgia, unspecified: ICD-10-CM

## 2017-09-07 DIAGNOSIS — F419 Anxiety disorder, unspecified: ICD-10-CM

## 2017-09-07 DIAGNOSIS — F1120 Opioid dependence, uncomplicated: ICD-10-CM

## 2017-09-07 DIAGNOSIS — K50019 Crohn's disease of small intestine with unspecified complications: Principal | ICD-10-CM

## 2017-09-07 MED ORDER — SODIUM CHLORIDE 0.9 % IV SOLP
INTRAVENOUS | 0 refills | Status: CN
Start: 2017-09-07 — End: ?

## 2017-09-07 MED ORDER — PEG-ELECTROLYTE SOLN 420 GRAM PO SOLR
2 L | ORAL | 0 refills | Status: AC | PRN
Start: 2017-09-07 — End: ?

## 2017-09-07 MED ORDER — OXYCODONE 5 MG PO TAB
5-10 mg | ORAL_TABLET | ORAL | 0 refills | 6.00000 days | Status: AC | PRN
Start: 2017-09-07 — End: ?
  Filled 2017-09-07 (×2): qty 40, 5d supply, fill #1

## 2017-09-07 NOTE — Progress Notes
Date of Service: 09/07/2017    Subjective:             Alyssa Cochran is a 36 y.o. female.    History of Present Illness  36 year old female with PMH of generalized anxiety disorder, depression presents to the GI clinic for follow up of chronic abdominal pain, altered bowel habits, fatigue, malnutrition for past 2 years.     Interval history:  - Patient was last seen in November 2018 when she presented to Dr. Charmain Diosdado clinic but noted to have seizure-like episode along with right loss of consciousness and was rapid responded and admitted to the hospital.  Patient left the AGAINST MEDICAL ADVICE during that admission and got readmitted in the next few days.  -During November 2018 when she was admitted at [facility] she was seen by gastroenterology service.  She underwent EGD which was normal except for candidal esophagitis and was treated appropriately with anticoagulation.  Colonoscopy was done which showed normal terminal ileum and normal colonic mucosa and random colonic biopsies does not show any evidence of chronic inflammation.  Mild focal active colitis in the rectum noted likely secondary to bowel prep related colitis and no chronicity noted.  -Patient was sent home on prescription of oxycodone 5 mg 1-2 tablets every 6 hours and patient reports that she ran out of it for the last few weeks.  She has been tablet of prednisone and she is currently not taking any prednisone at this point.  -Patient takes Tylenol which does not help with her pain and she could not tolerate ibuprofen because it upsets her stomach.  -Patient reports he continues to have severe abdominal pain diffusely all over the abdomen along with nausea and vomiting.  She could not have good oral intake due to this abdominal pain and nausea and vomiting.  She has 4-5 watery bowel movements sometimes mixed with blood for the past several weeks.  -Patient was recently admitted to emergency department  for abdominal pain and was discharged on  -Prescription of pain medicines.  Patient is here in the clinic with her husband.  She has 2 kids.  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  -Patient was brought to have video capsule endoscopy as an outpatient to rule out small bowel Crohn's disease.    Prior history:  Patient reports all had GI symptoms started 2 years back in 2015 when she was seen by Dr. Phil Dopp at [facility] gastroenterology in [place].  Initially she was diagnosed to have irritable bowel syndrome and was treated with Levsin, Bentyl, viberzi, Imodium for altered bowel habits without much improvement.  She initially had a symptoms of abdominal pain, intermittent nausea and vomiting, altered bowel habits with diarrhea predominant along with fatigue.  She underwent EGD and colonoscopy in later part of 2015, EGD without any abnormality, colonoscopy showed scattered areas of mild erythematous mucosa the left side of the colon, small bowel biopsy negative for celiac disease, rectosigmoid biopsies consistent with focal active colitis and other colonic biopsies consistent with melanosis coli.  She was tested positive for E. coli at that time and was treated appropriately with antibiotics but patient did not improve with continued presence of symptoms.  She also underwent another flexible sigmoidoscopy in November 2016 by Dr. Phil Dopp which showed a erythematous mucosa along the rectosigmoid with freability and biopsy consistent with chronic inflammation. 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  She was referred to Dr. Lynett Grimes at the [facility] for follow-up after the GI complaints.  Patient reports he felt a little better after surgery but all other symptoms recurred again.  Based on biopsy report from resected small bowel she was diagnosed with Crohn's disease by Dr. Lynett Grimes and was treated initially with the budesonide which was changed to prednisone 40 mg since June 2017.  She is currently taking prednisone 40 mg without much effect on her abdominal pain along with altered bowel habits.  She has been tried on Lialda 1.2 mg daily in 2016 with no effect.  She is currently taking hydrocodone 5 mg every 6 as needed along with tramadol for her abdominal pain and being managed by pain management at [facility].        Review of Systems   Constitutional: Positive for appetite change and fatigue.   HENT: Negative.    Eyes: Negative.    Respiratory: Negative.    Cardiovascular: Negative.    Gastrointestinal: Positive for abdominal pain, anal bleeding, blood in stool, diarrhea, nausea and vomiting.   Endocrine: Negative.    Genitourinary: Negative.    Musculoskeletal: Negative.    Skin: Negative.    Allergic/Immunologic: Negative.    Neurological: Negative.    Hematological: Negative.    Psychiatric/Behavioral: Negative.    All other systems reviewed and are negative.        Objective:         ??? albuterol 0.083% (PROVENTIL; VENTOLIN) 2.5 mg /3 mL (0.083 %) nebulizer solution Inhale 3 mL solution by nebulizer as directed every 6 hours as needed for Wheezing or Shortness of Breath.   ??? brexpiprazole 1 mg tab Take 1 tablet by mouth daily.   ??? citalopram (CELEXA) 40 mg tablet Take 40 mg by mouth daily.   ??? fluconazole (DIFLUCAN) 200 mg tablet Take one tablet by mouth daily. Start taking on 08/17/2017   ??? fluticasone (FLONASE) 50 mcg/actuation nasal spray Apply 1 spray to each nostril as directed daily. Shake bottle gently before using.   ??? mirtazapine (REMERON) 7.5 mg tablet Take one tablet by mouth at bedtime daily.   ??? ondansetron (ZOFRAN ODT) 4 mg rapid dissolve tablet Dissolve 4 mg by mouth every 8 hours as needed for Nausea or Vomiting. Place on tongue to disolve.   ??? oxyCODONE (ROXICODONE, OXY-IR) 5 mg tablet Take one tablet to two tablets by mouth every 6 hours as needed.   ??? pantoprazole DR (PROTONIX) 40 mg tablet Take 40 mg by mouth daily.   ??? prednisone (DELTASONE) 5 mg tablet Take 7 tabs by mouth daily x7 days then 6 tabs daily x7 days then 5 tabs daily x7 days then 4 tabs daily x7 days then 3 tabs daily x7 days then 2 tabs daily x7 days then 1 tab daily x7 days then stop   ??? pyridoxine (VITAMIN B-6) 50 mg tablet Take one tablet by mouth daily. This medication may be purchased from over the counter from your preferred pharmacy   ??? traZODone (DESYREL) 50 mg tablet Take one tablet by mouth at bedtime as needed.     Vitals:    09/07/17 1252   BP: 128/87   Pulse: 105   Resp: 16   Temp: 36.6 ???C (97.9 ???F)  TempSrc: Oral   Weight: 42.9 kg (94 lb 9.6 oz)   Height: 157.5 cm (62)     Body mass index is 17.3 kg/m???.     Physical Exam  BP 128/87  - Pulse 105  - Temp 36.6 ???C (97.9 ???F) (Oral)  - Resp 16  - Ht 157.5 cm (62)  - Wt 42.9 kg (94 lb 9.6 oz)  - BMI 17.30 kg/m???   General appearance: alert, cooperative and no distress  Head: Normocephalic, atraumatic  Eyes: No scleral icterus  Oropharynx: No erythema, ulcers  Neck: supple  Lungs: clear to auscultation bilaterally  Heart: regular rate and rhythm, S1, S2 normal, no murmur, click, rub or gallop  Abdomen: Soft, tender on touch diffusely, non- distended, no masses palpable, no organomegaly, BS+ve, surgical scare seen   Neurologic: No focal deficits  Skin: no obvious rashes  Musculoskeletal: No obvious joint inflammation  Extremities: No pedal edema noted       Assessment and Plan:  36 year old female with PMH of generalized anxiety disorder, depression presents to the GI clinic for follow up of chronic abdominal pain, altered bowel habits, fatigue, malnutrition for past 2 years. 1.  Generalized abdominal pain, more prominent in the lower abdomen for past 1-2 years.    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  H/o of small bowel resection (10 inches) on 12/2015 at [facility], [place] for intestinal obstruction due to intussception. Pathology showed findings consistent with crohn's disease as per pt but we do not have records to review yet. Recent work up at [facility] with EGD and colonosocpy along with CT scan does not show any evidence of any crohn's disease.   4.  Malnutrition with BMI of 17.3  5. H/o of C diff in early 2017 with no recurrence   6.  H/o of cholecystectomy in 2012 with need for TPN for short term following surgery for unclear reasons   7.  Generalized anxiety disorder, depression on Celexa.   ???  OSH endoscopies:  EGD and colonoscopy 04/2014 by Dr. Phil Dopp, EGD without any abnormality, colonoscopy showed scattered areas of mild erythematous mucosa the left side of the colon, small bowel biopsy negative for celiac disease, rectosigmoid biopsies consistent with focal active colitis and other colonic biopsies consistent with melanosis coli.  Flexible sigmoidoscopy in November 2016 by Dr. Phil Dopp which showed a erythematous mucosa along the rectosigmoid with freability and biopsy consistent with chronic inflammation.    Gering work up in 08/14/2017  She underwent EGD which was normal except for candidal esophagitis and was treated appropriately with anticoagulation.  Colonoscopy was done which showed normal terminal ileum and normal colonic mucosa and random colonic biopsies does not show any evidence of chronic inflammation.  Mild focal active colitis in the rectum noted likely secondary to bowel prep related colitis and no chronicity noted.    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  Discussed in detail with the patient and her husband regarding addiction to narcotics and pathophysiology behind narcotic  bowel syndrome.  Counseled about establishing care with pain management specialist who can help her with narcotic addiction with several programs and we provided the list of pain management specialists to choose from.   -We would giver her short term script for oxycodone 5 mg prn until she establishes care with pain management specialist and also discussed that she will not get any more pain medications from our clinic and should only be managed by her pain management specialist   -We will schedule her for capsule endoscopy as an outpatient to rule out small bowel Crohn's disease.  Patient already passed patency capsule without any issues.  -RTC to clinic in 3 months    Patient seen and discussed with Dr. Janene Madeira    Tonye Becket  GI fellow   Pager [phone]  09/07/2017 2:11 PM     ATTESTATION    I personally performed the key portions of the E/M visit, discussed case with resident and concur with resident documentation of history, physical exam, assessment, and treatment plan unless otherwise noted.     Skyy Dorton is in GI clinic.  We have seen her in clinic on two occasions.  Initially, we saw her for evaluation of suspected Crohn's disease.  We recommended evaluation that was not completed due to insurance issues.  She returned about a year later and I evaluated her.  She had had a fall on the way to the appointment with change in level of consciousness and seizures so she was sent to the emergency room and admitted.  The patient left AMA. She returned to the emergency room a few weeks later.  She was admitted and underwent GI evaluation including EGD, colonoscopy and CT scan and laboratory evaluation.  On that evaluation, there was no evidence of active inflammatory bowel disease.  It was recommended that she complete a video capsule endoscopy of her small bowel to look for any small bowel ulcerations.  The patient swallowed a patency capsule and x-ray showed that it passed into the colon.  I think the patient's main issue at present is addiction to narcotics.  She has pain seeking behaviors and multiple emergency room visits at different hospitals and is frequently asking for narcotic pain medications for her pain.  I was honest with the patient and her husband and told him that I think that this is her main clinical problem at the present time.  I think she may have some degree of narcotic bowel which is exacerbating her symptoms.  I advised Mrs. Devora to establish care with a pain management physician or narcotic addiction program.  We provided her with a list of regional pain management practices.  If she needs a referral to one of the specialists, we will be happy to provide it.  I did give her a one-time refill of oxycodone 5 mg tablets #40 to help prevent withdrawal symptoms while she is establishing care with a pain management physician.  I counseled her that GI clinic will not be providing any additional narcotic prescriptions and we will not provide chronic pain management services.  I indicated that her therapeutic goal of coming back to GI clinic should not be to obtain further narcotic prescriptions.  I indicated that we would be happy to help evaluate her GI symptoms and discuss medications but that narcotic pain medications would not be part of the therapeutic plan.  This is not a good strategy for managing chronic abdominal pain.  We will schedule the patient for video capsule endoscopy  and see her back in follow-up to discuss whether or not any anti-Crohn's therapy is indicated.    Patient Instructions   1. Schedule small bowel video capsule endoscopy  2. One time refill of Oxycodone  3. Establish care with a Pain Management Physician - list provided   4. No further prescriptions for narcotics from GI clinic   5. Contact our office and we can fax a referral to the Pain Management office    Staff name:  Prudy Feeler, MD Date:  09/07/2017

## 2017-09-08 ENCOUNTER — Ambulatory Visit: Admit: 2017-09-08 | Discharge: 2017-09-08 | Payer: BC Managed Care – HMO | Primary: Adult Health

## 2017-10-18 ENCOUNTER — Encounter: Admit: 2017-10-18 | Discharge: 2017-10-18 | Payer: BC Managed Care – HMO | Primary: Adult Health

## 2017-10-19 ENCOUNTER — Encounter: Admit: 2017-10-19 | Discharge: 2017-10-19 | Payer: BC Managed Care – HMO | Primary: Adult Health

## 2017-10-20 ENCOUNTER — Encounter: Admit: 2017-10-20 | Discharge: 2017-10-20 | Payer: BC Managed Care – HMO | Primary: Adult Health

## 2017-12-23 ENCOUNTER — Encounter: Admit: 2017-12-23 | Discharge: 2017-12-23 | Payer: BC Managed Care – HMO | Primary: Adult Health

## 2017-12-23 DIAGNOSIS — R109 Unspecified abdominal pain: ICD-10-CM

## 2017-12-23 DIAGNOSIS — I519 Heart disease, unspecified: Principal | ICD-10-CM

## 2017-12-23 DIAGNOSIS — M549 Dorsalgia, unspecified: ICD-10-CM

## 2018-02-15 ENCOUNTER — Encounter: Admit: 2018-02-15 | Discharge: 2018-02-15 | Payer: BC Managed Care – HMO | Primary: Adult Health

## 2019-01-24 IMAGING — CT Abdomen^1_ABDOMEN_PELVIS_WITH (Adult)
1 series · 15 of 32 positions shown, 19 images · IV contrast (APPLIED)
Comparison: none

[Series 2: abd/pelvis with 5.0 soft tissue · axial · 0.59mm/px · z∈[+377,+737]mm · 15 of 79 slices shown, 19 images]
[im 6/79  soft-tissue]
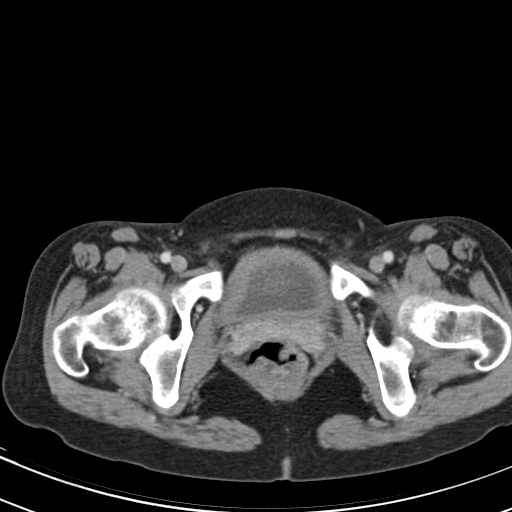
[im 6/79  bone]
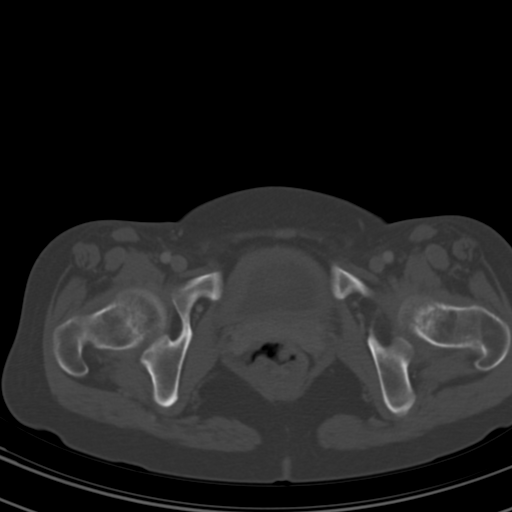
[im 11/79  soft-tissue]
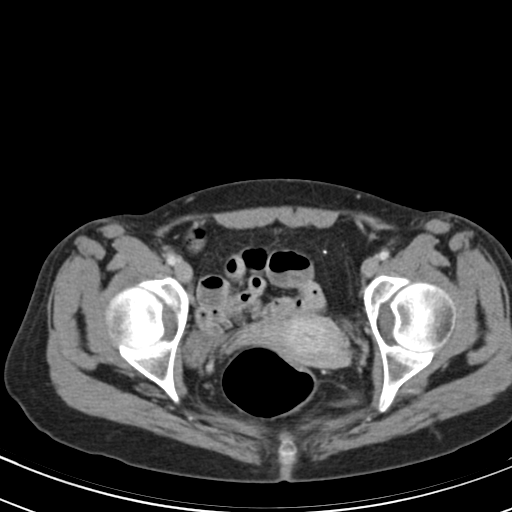
[im 16/79  soft-tissue]
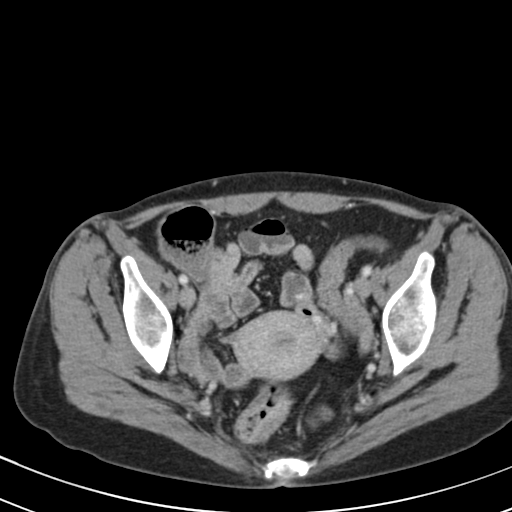
[im 23/79  soft-tissue]
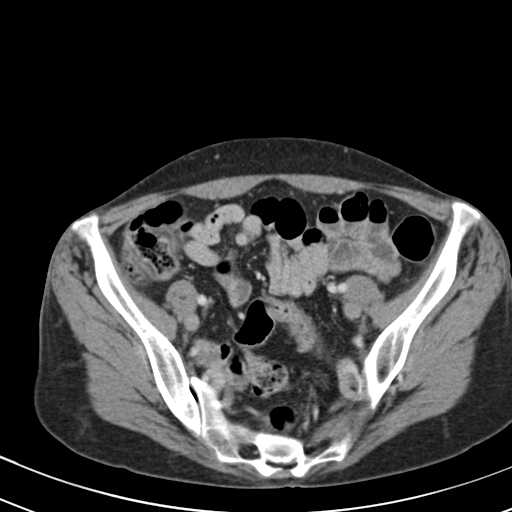
[im 28/79  soft-tissue]
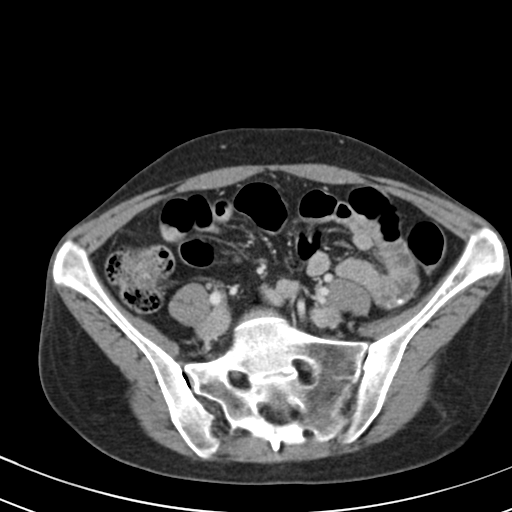
[im 33/79  soft-tissue]
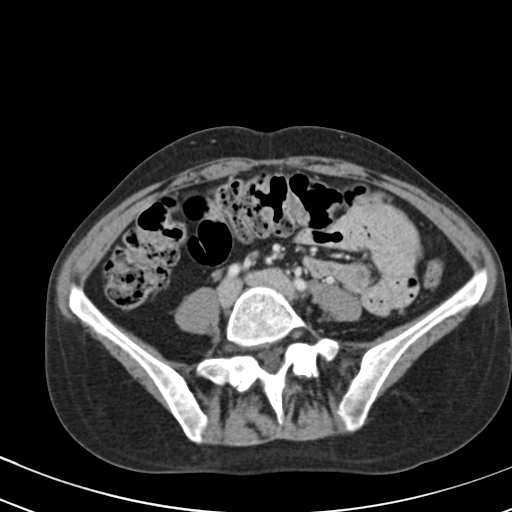
[im 41/79  soft-tissue]
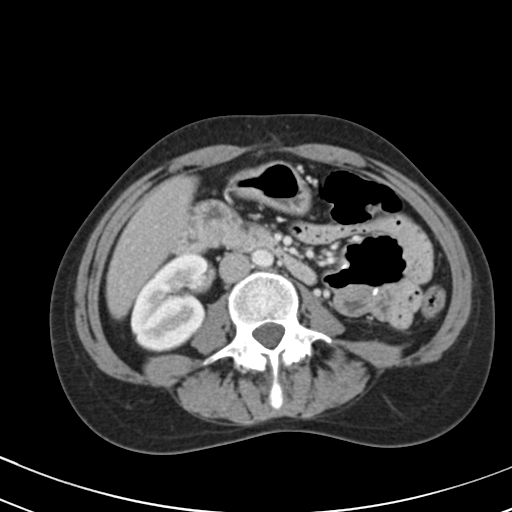
[im 46/79  soft-tissue]
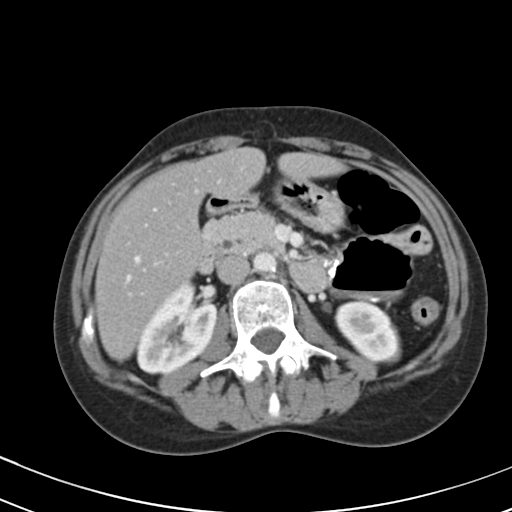
[im 51/79  soft-tissue]
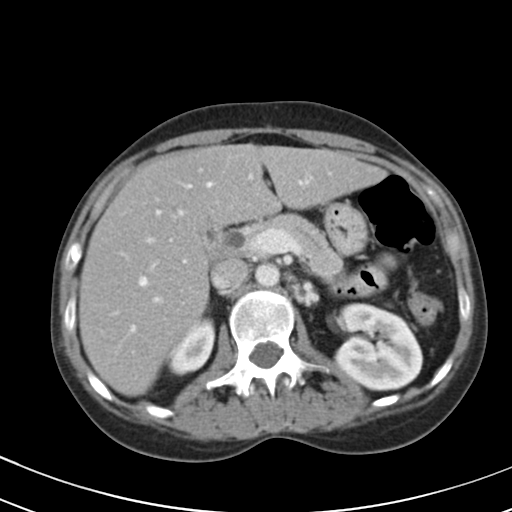
[im 51/79  bone]
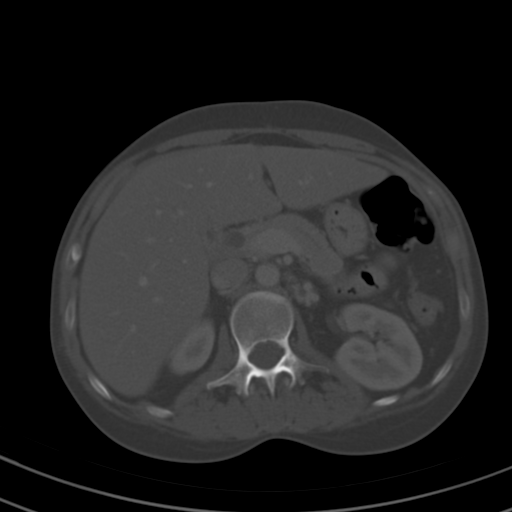
[im 56/79  soft-tissue]
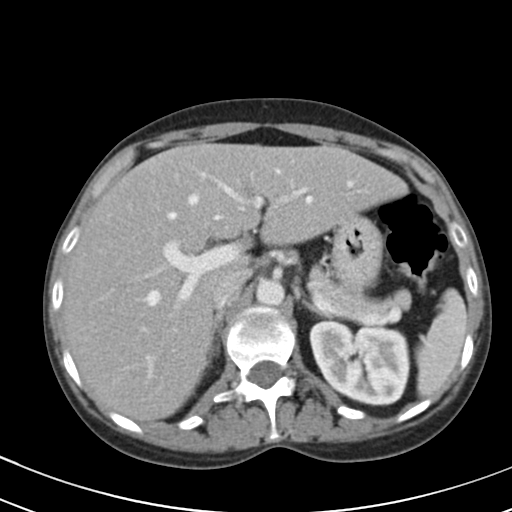
[im 63/79  soft-tissue]
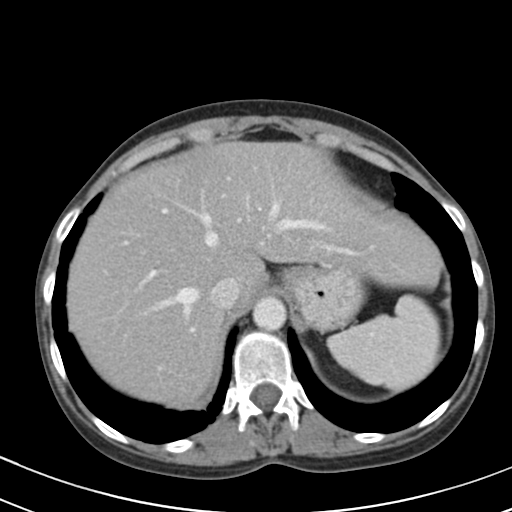
[im 68/79  soft-tissue]
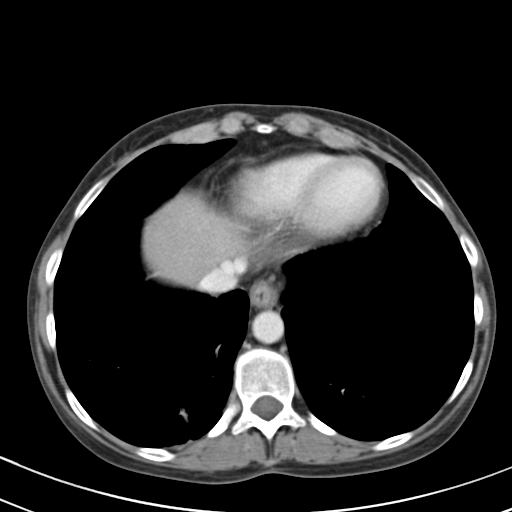
[im 68/79  lung]
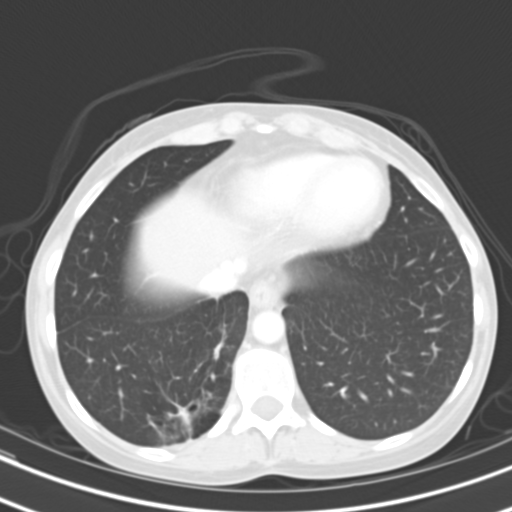
[im 71/79  lung]
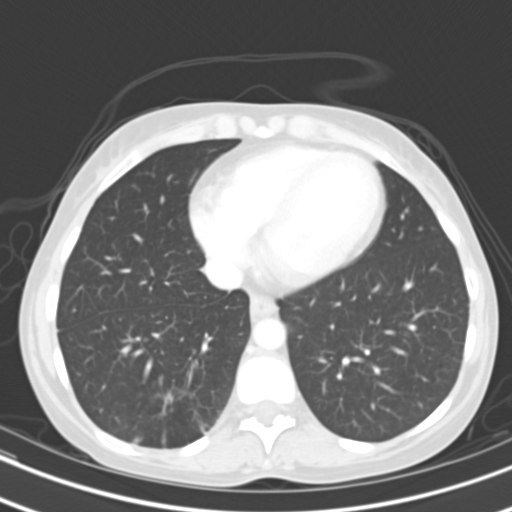
[im 73/79  soft-tissue]
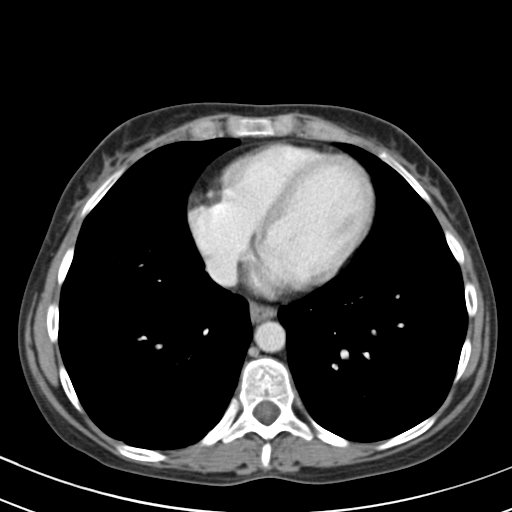
[im 73/79  lung]
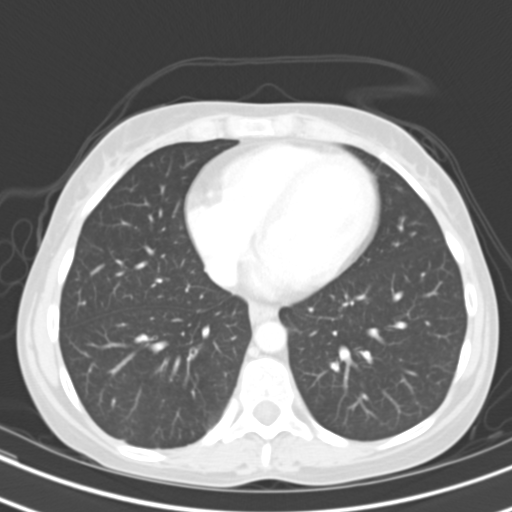
[im 76/79  lung]
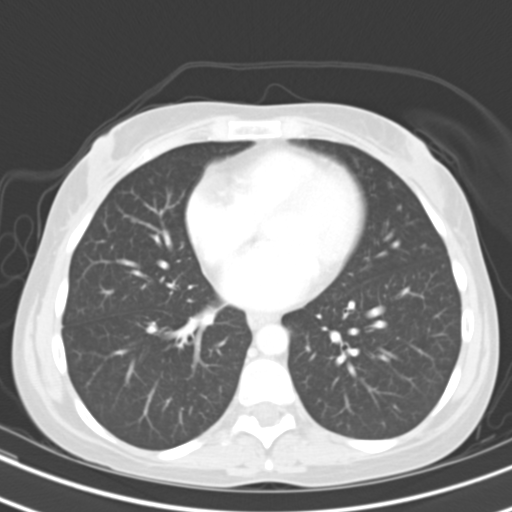

[15 of 32 positions shown; findings below may reference images not displayed]

EXAM

CT abdomen and pelvis.

INDICATION

Hx of Crohns, colitis with recurrent abdominal pain, NVD
C/O ABD PAIN (ESP RLQ AND LLQ). N/V/D. H/O CHRON'S, GASTROPASRESIS, SMALL
BOWEL RESCTION DUE TO INTUSSUCEPTION, CHOLE, TUBAL. CREAT.-0.64, IU3-PP8.

TECHNIQUE

Following the uneventful administration of 100 cc Omnipaque 300 low osmolar intravenous contrast,
routine CT scan of the abdomen and pelvis was performed. All CT scans at this facility use dose
modulation, iterative reconstruction, and/or weight based dosing when appropriate to reduce
radiation dose to as low as reasonably achievable.

COMPARISONS

06 April, 2017.

FINDINGS

There is no evidence of small bowel dilatation or air-fluid levels to suggest small bowel
obstruction. Diffuse colonic stool is present. The urinary bladder is mostly decompressed with
suggestion of thickened wall. The uterus and adnexa are unremarkable. There is postsurgical change
of cholecystectomy with intrahepatic and extrahepatic biliary ductal dilatation. The pancreas is
unremarkable. The lung bases are clear. Right basilar subsegmental atelectasis and scarring is
unchanged. The heart is not enlarged. The liver, adrenal glands, spleen, and kidneys are within
normal limits otherwise.

IMPRESSION

No acute obstructive or inflammatory pathology in the abdomen or pelvis.

## 2019-02-08 IMAGING — CT Abdomen^1_ABDOMEN_PELVIS_WITH (Adult)
1 series · 15 of 32 positions shown, 19 images · non-contrast
Comparison: none

[Series 2: abd/pelvis with 5.0 soft tissue · axial · 0.59mm/px · z∈[-394,-40]mm · 15 of 79 slices shown, 19 images]
[im 6/79  soft-tissue]
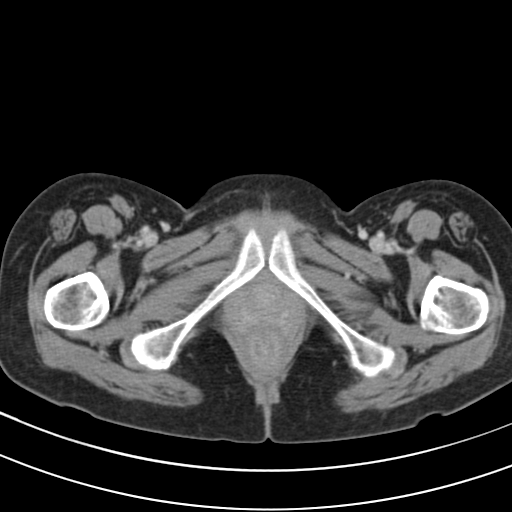
[im 6/79  bone]
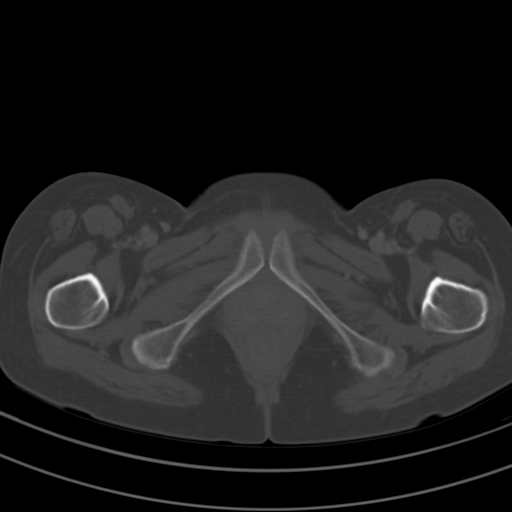
[im 11/79  soft-tissue]
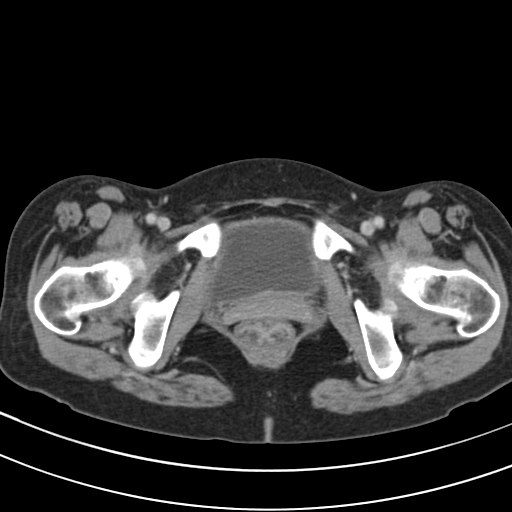
[im 16/79  soft-tissue]
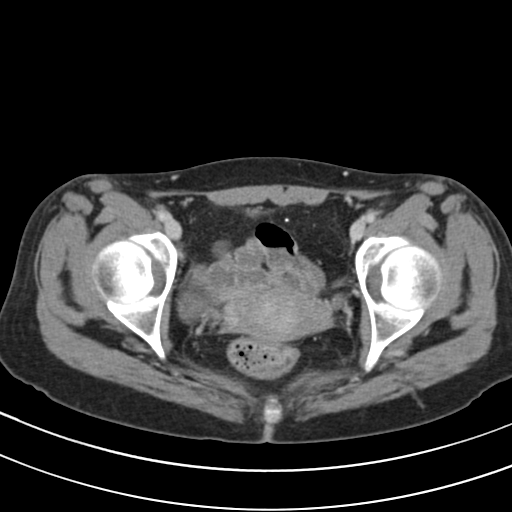
[im 23/79  soft-tissue]
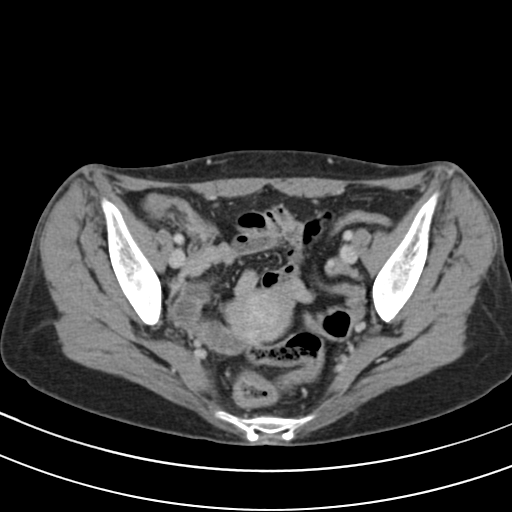
[im 28/79  soft-tissue]
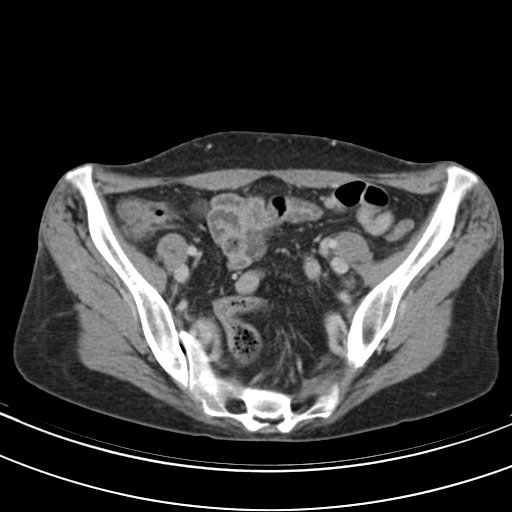
[im 33/79  soft-tissue]
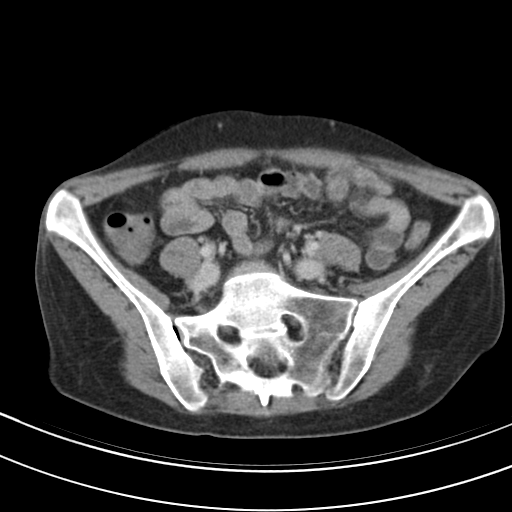
[im 41/79  soft-tissue]
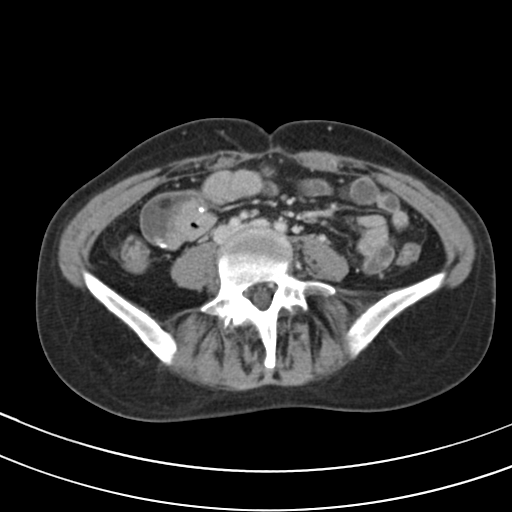
[im 46/79  soft-tissue]
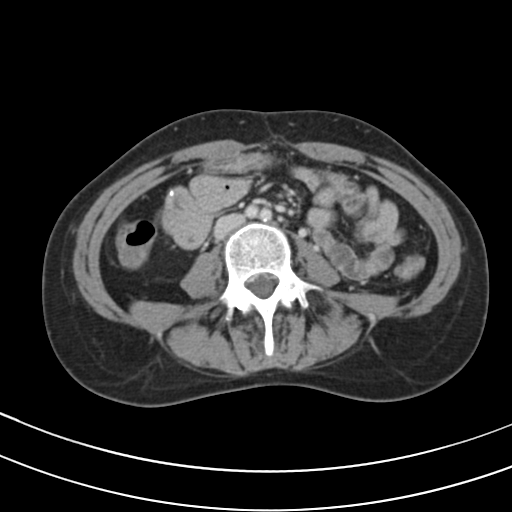
[im 51/79  soft-tissue]
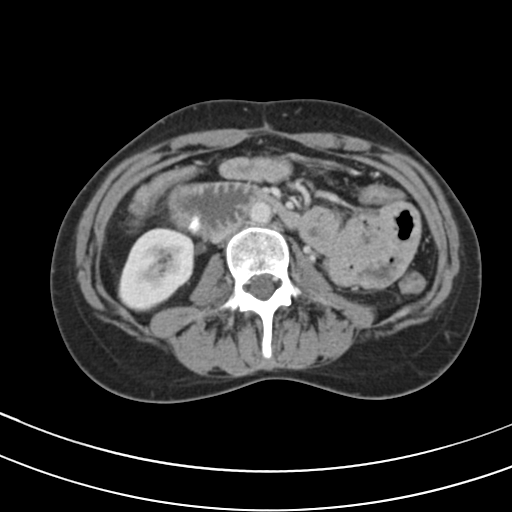
[im 51/79  bone]
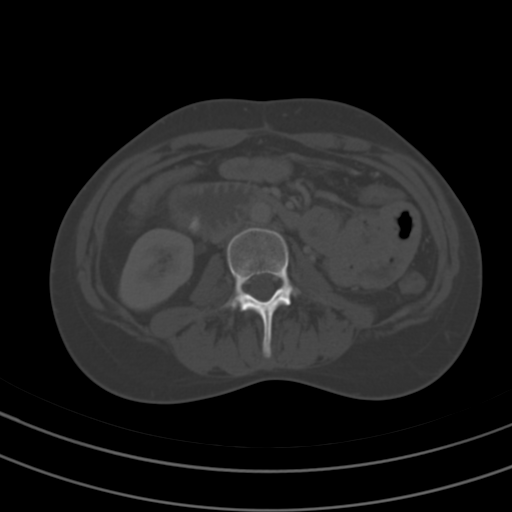
[im 56/79  soft-tissue]
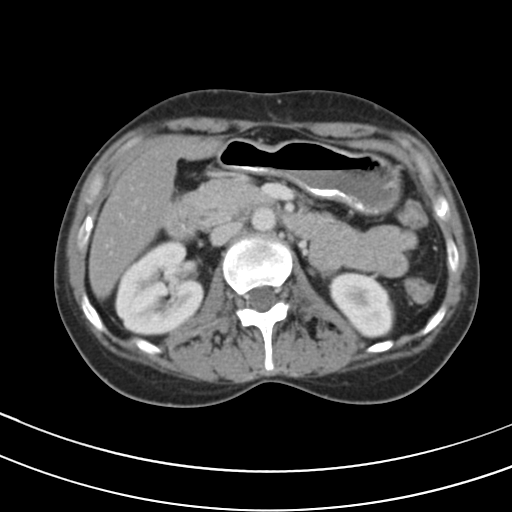
[im 63/79  soft-tissue]
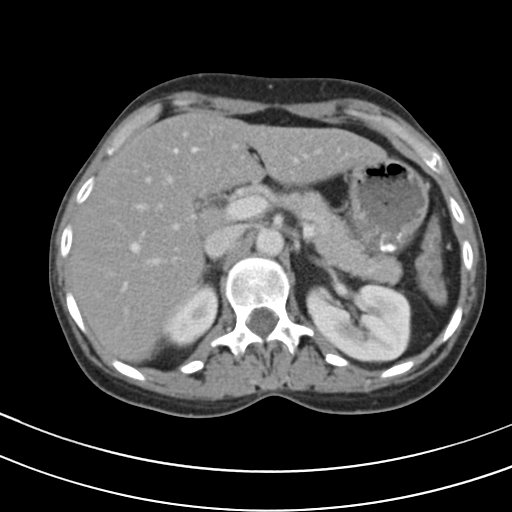
[im 68/79  soft-tissue]
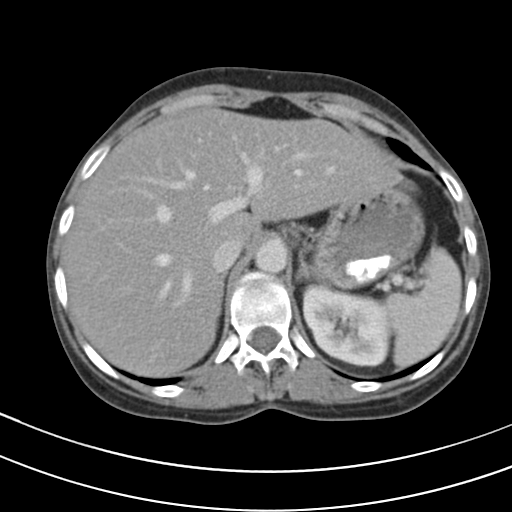
[im 68/79  lung]
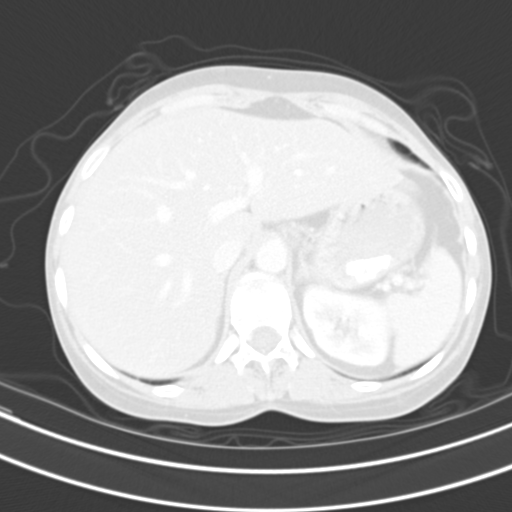
[im 71/79  lung]
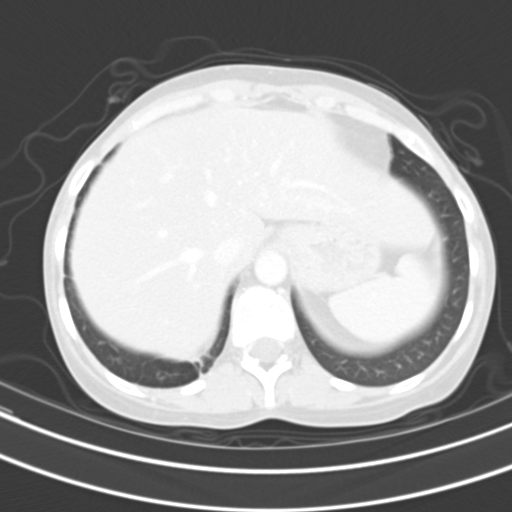
[im 73/79  soft-tissue]
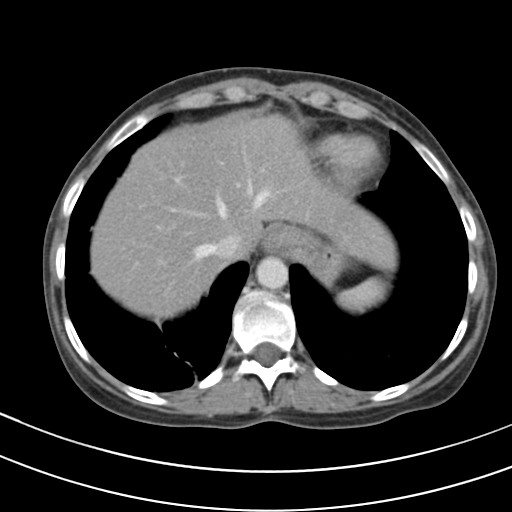
[im 73/79  lung]
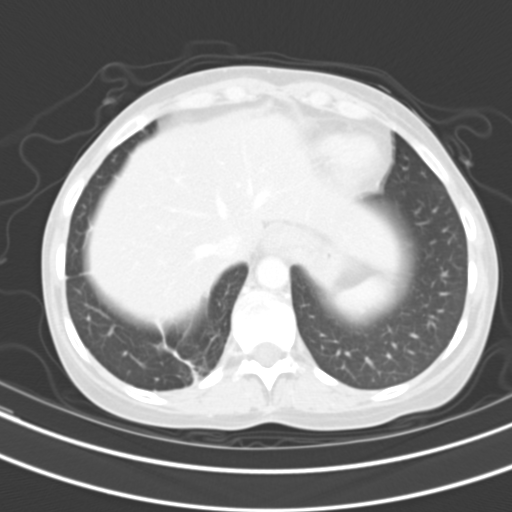
[im 76/79  lung]
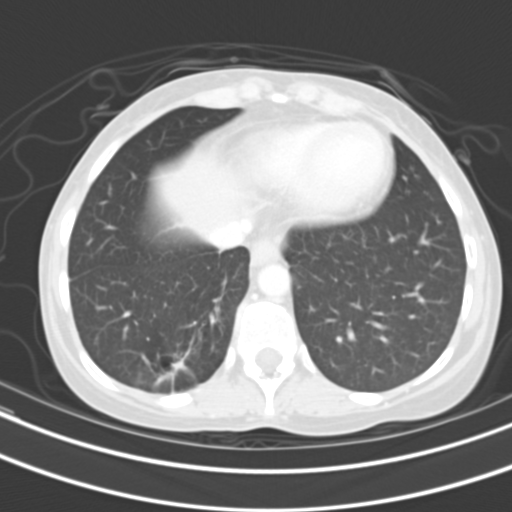

[15 of 32 positions shown; findings below may reference images not displayed]

DIAGNOSTIC STUDIES

EXAM

CT ABDOMEN AND PELVIS WITH CONTRAST

INDICATION

Generalized abdominal pain. Crohn's disease.

TECHNIQUE

Contiguous transaxial imaging through the abdomen and pelvis were obtained during the uneventful
administration of 100 milliliter Omnipaque 300 low osmolar intravenous iodinated contrast material.
Coronal and sagittal reformations were obtained from the transaxial source data.

All CT scans at this facility use dose modulation, iterative reconstruction, and/or weight based
dosing when appropriate to reduce radiation dose to as low as reasonably achievable.

COMPARISONS

No comparison is available

FINDINGS

The lung bases are clear and the heart size is normal. No free intraperitoneal gas is detected.

The liver and spleen are normal size and contour without focal lesion. Cholecystectomy clips. No
adrenal masses and the pancreas is unremarkable. The kidneys concentrate iodinated contrast
material normally and are without enhancing mass or hydronephrosis.

The abdominal aorta and inferior vena cava are normal caliber. The urinary bladder is distended
and nonopacified. Retroverted uterus. There is no abdominopelvic lymphadenopathy or ascites. The
large and small bowel loops are normal caliber. Submucosal fatty infiltration of the terminal ileum
keeping with the patient's Crohn's disease. No abnormal prominence of the Vasa recta. Evidence of
prior partial small bowel resection. No perienteric or pericolonic inflammatory fat stranding.
Motion artifact degrades detail. No segmental or focal mural thickening of the small or large
bowel.

No destructive bone lesion is identified.

IMPRESSION

1. Previous partial small bowel resection without evidence of bowel obstruction, ascites, fluid
collection, or inflammatory mass.

## 2020-06-26 ENCOUNTER — Encounter: Admit: 2020-06-26 | Discharge: 2020-06-26 | Payer: BC Managed Care – HMO | Primary: Adult Health

## 2020-06-26 NOTE — Progress Notes
39 yo female past medical history significant for depression, anxiety, chronic colitis reported to be Crohn's disease.  Pt presents to ER with altered mental status.  Per ER Dr workup Dx is acute liver failure  and GI at sending states she needs to be at transplant center.  Pt is confused but responds to voice  107/65  115  16  98% on RA  36.4  Labs  3.1  10.2  32.2  123  134  3.4  96  16  28  1.85  AST 4451  ALT 2912  Ammonia 74  T-bili 1.4  INR 2.57  Tylenol level 29.1  Lactate >10    Pt was given 58ml/kg NS bolus and repeat lactate is 8.3    Ct head and CXR both nothing acute.    Unable to get history from Pt to know if drinker or tylenol OD.    Has not given lactulose, Abx, or mucomyst but can if accepting wants
